# Patient Record
Sex: Male | Born: 1985 | Race: White | Hispanic: No | Marital: Single | Smoking: Current every day smoker
Health system: Southern US, Community
[De-identification: ages and names within clinical notes are randomized; demographics above are authoritative.]

---

## 2008-07-20 ENCOUNTER — Emergency Department: Payer: Self-pay | Admitting: Emergency Medicine

## 2009-03-08 ENCOUNTER — Emergency Department: Payer: Self-pay | Admitting: Emergency Medicine

## 2009-07-14 ENCOUNTER — Emergency Department: Payer: Self-pay | Admitting: Internal Medicine

## 2009-09-14 ENCOUNTER — Inpatient Hospital Stay: Payer: Self-pay | Admitting: Psychiatry

## 2010-11-08 ENCOUNTER — Emergency Department: Payer: Self-pay | Admitting: Emergency Medicine

## 2011-02-10 ENCOUNTER — Emergency Department: Payer: Self-pay | Admitting: Emergency Medicine

## 2013-08-15 ENCOUNTER — Encounter (HOSPITAL_COMMUNITY): Payer: Self-pay | Admitting: Emergency Medicine

## 2013-08-15 ENCOUNTER — Emergency Department (HOSPITAL_COMMUNITY): Payer: Self-pay

## 2013-08-15 ENCOUNTER — Emergency Department (HOSPITAL_COMMUNITY)
Admission: EM | Admit: 2013-08-15 | Discharge: 2013-08-15 | Disposition: A | Discharge status: Home / Self Care | Payer: Self-pay | Attending: Emergency Medicine | Admitting: Emergency Medicine

## 2013-08-15 DIAGNOSIS — Y92410 Unspecified street and highway as the place of occurrence of the external cause: Secondary | ICD-10-CM | POA: Insufficient documentation

## 2013-08-15 DIAGNOSIS — S0501XA Injury of conjunctiva and corneal abrasion without foreign body, right eye, initial encounter: Secondary | ICD-10-CM

## 2013-08-15 DIAGNOSIS — IMO0002 Reserved for concepts with insufficient information to code with codable children: Secondary | ICD-10-CM | POA: Insufficient documentation

## 2013-08-15 DIAGNOSIS — Y9389 Activity, other specified: Secondary | ICD-10-CM | POA: Insufficient documentation

## 2013-08-15 DIAGNOSIS — F17200 Nicotine dependence, unspecified, uncomplicated: Secondary | ICD-10-CM | POA: Insufficient documentation

## 2013-08-15 DIAGNOSIS — S058X9A Other injuries of unspecified eye and orbit, initial encounter: Secondary | ICD-10-CM | POA: Insufficient documentation

## 2013-08-15 LAB — COMPREHENSIVE METABOLIC PANEL WITH GFR
ALT: 32 U/L (ref 0–53)
AST: 25 U/L (ref 0–37)
Albumin: 4.7 g/dL (ref 3.5–5.2)
Alkaline Phosphatase: 74 U/L (ref 39–117)
BUN: 9 mg/dL (ref 6–23)
CO2: 29 meq/L (ref 19–32)
Calcium: 9.5 mg/dL (ref 8.4–10.5)
Chloride: 103 meq/L (ref 96–112)
Creatinine, Ser: 1.16 mg/dL (ref 0.50–1.35)
GFR calc Af Amer: >90 mL/min
GFR calc non Af Amer: 86 mL/min — ABNORMAL LOW
Glucose, Bld: 83 mg/dL (ref 70–99)
Potassium: 4.6 meq/L (ref 3.5–5.1)
Sodium: 143 meq/L (ref 135–145)
Total Bilirubin: 0.2 mg/dL — ABNORMAL LOW (ref 0.3–1.2)
Total Protein: 7.8 g/dL (ref 6.0–8.3)

## 2013-08-15 LAB — CBC WITH DIFFERENTIAL/PLATELET
Basophils Relative: 1 % (ref 0–1)
Eosinophils Absolute: 0.3 10*3/uL (ref 0.0–0.7)
Eosinophils Relative: 2 % (ref 0–5)
HCT: 50.5 % (ref 39.0–52.0)
Hemoglobin: 17.5 g/dL — ABNORMAL HIGH (ref 13.0–17.0)
MCH: 31.9 pg (ref 26.0–34.0)
MCHC: 34.7 g/dL (ref 30.0–36.0)
MCV: 92 fL (ref 78.0–100.0)
Monocytes Absolute: 0.7 10*3/uL (ref 0.1–1.0)
Monocytes Relative: 6 % (ref 3–12)
Neutrophils Relative %: 68 % (ref 43–77)
WBC: 12.9 10*3/uL — ABNORMAL HIGH (ref 4.0–10.5)

## 2013-08-15 MED ORDER — FLUORESCEIN SODIUM 1 MG OP STRP
1.0000 | ORAL_STRIP | Freq: Once | OPHTHALMIC | Status: AC
  Administered 2013-08-15: 1 via OPHTHALMIC
  Filled 2013-08-15: qty 1

## 2013-08-15 MED ORDER — TOBRAMYCIN 0.3 % OP OINT
TOPICAL_OINTMENT | Freq: Once | OPHTHALMIC | Status: AC
  Administered 2013-08-15: 1 via OPHTHALMIC
  Filled 2013-08-15: qty 3.5

## 2013-08-15 MED ORDER — LORAZEPAM 2 MG/ML IJ SOLN
2.0000 mg | Freq: Once | INTRAMUSCULAR | Status: AC
  Administered 2013-08-15: 2 mg via INTRAVENOUS
  Filled 2013-08-15: qty 1

## 2013-08-15 MED ORDER — HYDROMORPHONE HCL PF 1 MG/ML IJ SOLN
1.0000 mg | INTRAMUSCULAR | Status: AC
  Administered 2013-08-15: 1 mg via INTRAVENOUS
  Filled 2013-08-15: qty 1

## 2013-08-15 MED ORDER — TETRACAINE HCL 0.5 % OP SOLN
2.0000 [drp] | Freq: Once | OPHTHALMIC | Status: AC
  Administered 2013-08-15: 2 [drp] via OPHTHALMIC
  Filled 2013-08-15: qty 2

## 2013-08-15 NOTE — ED Notes (Signed)
Pt ambulatory to restroom

## 2013-08-15 NOTE — ED Notes (Signed)
Eye patch applied to right eye
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
## 2013-08-15 NOTE — ED Notes (Signed)
Spoke with resident, resident had previously removed neck brace

## 2013-08-15 NOTE — ED Notes (Signed)
Pt back from CT
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
## 2013-08-15 NOTE — ED Notes (Signed)
Pt transported to CT ?

## 2013-08-15 NOTE — ED Notes (Signed)
This nurse found pt with neck brace removed.

## 2013-08-15 NOTE — ED Notes (Signed)
Attempted to draw labs, unsuccessful. Contacted phlebotomy to draw.
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
## 2014-06-30 ENCOUNTER — Emergency Department: Payer: Self-pay | Admitting: Emergency Medicine

## 2016-01-25 ENCOUNTER — Encounter: Payer: Self-pay | Admitting: Emergency Medicine

## 2016-01-25 ENCOUNTER — Emergency Department
Admission: EM | Admit: 2016-01-25 | Discharge: 2016-01-25 | Disposition: A | Discharge status: Home / Self Care | Payer: Self-pay | Attending: Emergency Medicine | Admitting: Emergency Medicine

## 2016-01-25 ENCOUNTER — Emergency Department: Payer: Self-pay

## 2016-01-25 DIAGNOSIS — Y999 Unspecified external cause status: Secondary | ICD-10-CM | POA: Insufficient documentation

## 2016-01-25 DIAGNOSIS — Y939 Activity, unspecified: Secondary | ICD-10-CM | POA: Insufficient documentation

## 2016-01-25 DIAGNOSIS — S6010XA Contusion of unspecified finger with damage to nail, initial encounter: Secondary | ICD-10-CM

## 2016-01-25 DIAGNOSIS — S60012A Contusion of left thumb without damage to nail, initial encounter: Secondary | ICD-10-CM | POA: Insufficient documentation

## 2016-01-25 DIAGNOSIS — Y929 Unspecified place or not applicable: Secondary | ICD-10-CM | POA: Insufficient documentation

## 2016-01-25 DIAGNOSIS — S6702XA Crushing injury of left thumb, initial encounter: Secondary | ICD-10-CM

## 2016-01-25 DIAGNOSIS — F17200 Nicotine dependence, unspecified, uncomplicated: Secondary | ICD-10-CM | POA: Insufficient documentation

## 2016-01-25 DIAGNOSIS — W208XXA Other cause of strike by thrown, projected or falling object, initial encounter: Secondary | ICD-10-CM | POA: Insufficient documentation

## 2016-01-25 NOTE — Discharge Instructions (Signed)
Crush Injury, Fingers or Toes A crush injury means the fingers or toes are hurt by being squeezed (compressed). HOME CARE  Raise (elevate) the injured part above the level of your heart. Do this as much as you can for the first few days.  Put ice on the injured area.  Put ice in a plastic bag.  Place a towel between your skin and the bag.  Leave the ice on for 15-20 minutes, 03-04 times a day for the first 2 days.  Only take medicine as told by your doctor.  Use the injured part only as told by your doctor.  Change bandages (dressings) as told by your doctor.  Keep all doctor visits as told. GET HELP RIGHT AWAY IF:   There is redness, puffiness (swelling), or more pain in the injured finger or toe.  Yellowish-white fluid (pus) comes from the wound.  You have a fever.  A bad smell comes from the wound or bandage.  The wound breaks open.  You cannot move the injured finger or toe. MAKE SURE YOU:   Understand these instructions.  Will watch your condition.  Will get help right away if you are not doing well or get worse.   This information is not intended to replace advice given to you by your health care provider. Make sure you discuss any questions you have with your health care provider.   Document Released: 02/05/2010 Document Revised: 11/10/2011 Document Reviewed: 01/03/2011 Elsevier Interactive Patient Education 2016 [employer]  Subungual Hematoma  A subungual hematoma is a pocket of blood under the fingernail or toenail. The nail may turn blue or feel painful. HOME CARE  Put ice on the injured area.  Put ice in a plastic bag.  Place a towel between your skin and the bag.  Leave the ice on for 15-20 minutes, 03-04 times a day. Do this for the first 1 to 2 days.  Raise (elevate) the injured area to lessen pain and puffiness (swelling).  If you were given a bandage, wear it for as long as told by your doctor.  If part of your nail falls off, trim  the rest of the nail gently.  Only take medicines as told by your doctor. GET HELP RIGHT AWAY IF:  You have redness or puffiness around the nail.  You have yellowish-white fluid (pus) coming from the nail.  Your pain does not get better with medicine.  You have a fever. MAKE SURE YOU:  Understand these instructions.  Will watch your condition.  Will get help right away if you are not doing well or get worse.   This information is not intended to replace advice given to you by your health care provider. Make sure you discuss any questions you have with your health care provider.   Document Released: 11/10/2011 Document Reviewed: 01/03/2015 Elsevier Interactive Patient Education [employer]

## 2016-01-25 NOTE — ED Notes (Signed)
States he had something drop on to left thumb yesterday  Left thumb bruised and swollen

## 2016-01-25 NOTE — ED Provider Notes (Signed)
[facility] Emergency Department Provider Note ____________________________________________  Time seen: 1015  I have reviewed the triage vital signs and the nursing notes.  HISTORY  Chief Complaint  Finger Injury  HPI Nicholas Benson is a 30 y.o. male presents to the ED for evaluation of injury sustained to his left thumb yesterday.He is a right-hand dominant male who describes a crush injury to his left thumb yesterday. He reports good range of motion at the distal joint, but notes tightness and stiffness at the end of the fingertip. He also notices discoloration under the nail. He denies any cuts, scrapes, abrasions, or lacerations. He rates his discomfort at a 7/10 in triage.  History reviewed. No pertinent past medical history.  There are no active problems to display for this patient.  History reviewed. No pertinent past surgical history.  Current Outpatient Rx  Name  Route  Sig  Dispense  Refill  . ibuprofen (ADVIL,MOTRIN) 200 MG tablet   Oral   Take 200 mg by mouth every 6 (six) hours as needed for moderate pain.          Allergies Review of patient's allergies indicates no known allergies.  No family history on file.  Social History Social History  Substance Use Topics  . Smoking status: Current Every Day Smoker  . Smokeless tobacco: None  . Alcohol Use: Yes    Review of Systems  Constitutional: Negative for fever. Musculoskeletal: Negative for back pain. Left thumb pain as above Skin: Negative for rash. Neurological: Negative for headaches, focal weakness or numbness. ____________________________________________  PHYSICAL EXAM:  VITAL SIGNS: ED Triage Vitals  Enc Vitals Group     BP 01/25/16 0953 141/92 mmHg     Pulse Rate 01/25/16 0953 78     Resp 01/25/16 0953 17     Temp 01/25/16 0953 97.5 F (36.4 C)     Temp Source 01/25/16 0953 Oral     SpO2 01/25/16 0953 97 %     Weight 01/25/16 0953 160 lb (72.576 kg)   Height 01/25/16 0953 5\' 9"  (1.753 m)     Head Cir --      Peak Flow --      Pain Score 01/25/16 1002 7     Pain Loc --      Pain Edu? --      Excl. in GC? --    Constitutional: Alert and oriented. Well appearing and in no distress. Head: Normocephalic and atraumatic. Cardiovascular: Normal distal pulses and cap refill.  Respiratory: Normal respiratory effort. Musculoskeletal: No obvious deformity of the lef thumb. Patient with a 25% subungal hematoma noted proximally. No nailbed injury or finger laceration is appreciated. Mild distal phalanx edema is noted. Normal DIP ROM. Nontender with normal range of motion in all other extremities.  Neurologic: CN II-XII grossly intact. Normal gross sensation. Normal speech and language. No gross focal neurologic deficits are appreciated. Skin:  Skin is warm, dry and intact. No rash noted. ____________________________________________   RADIOLOGY  Left Thumb  IMPRESSION: Negative. ____________________________________________  SUBUNGAL HEMATOMA DRAINAGE Performed by: Lissa HoardMenshew, Stellah Donovan V Bacon Consent: Verbal consent obtained. Risks and benefits: risks, benefits and alternatives were discussed Type: abscess  Body area: left thumb  Incision was made with a electrocautery.  Complexity: simple   Drainage: bloody  Drainage amount: minimal  Patient tolerance: Patient tolerated the procedure well with no immediate complications. ____________________________________________  INITIAL IMPRESSION / ASSESSMENT AND PLAN / ED COURSE  Patient with a crush injury to the  left thumb without radiologic evidence of fracture dislocation. He sustained a small subungual hematoma following the injury. He will be discharged with instruction on crush injury management and management of subungual hematoma. ____________________________________________  FINAL CLINICAL IMPRESSION(S) / ED DIAGNOSES  Final diagnoses:  Crushing injury of left thumb, initial  encounter  Hematoma, subungual, finger, left, initial encounter     Lissa Hoard, PA-C 01/25/16 1124  Phineas Semen, MD 01/25/16 1214

## 2016-01-25 NOTE — ED Notes (Signed)
Patient left facility before discharge instructions or papers could be given.  Patient ambulated well from facility in NAD at that time.

## 2017-02-08 ENCOUNTER — Emergency Department
Admission: EM | Admit: 2017-02-08 | Discharge: 2017-02-08 | Disposition: A | Discharge status: Home / Self Care | Payer: Self-pay | Attending: Emergency Medicine | Admitting: Emergency Medicine

## 2017-02-08 ENCOUNTER — Encounter: Payer: Self-pay | Admitting: *Deleted

## 2017-02-08 DIAGNOSIS — H02826 Cysts of left eye, unspecified eyelid: Secondary | ICD-10-CM | POA: Insufficient documentation

## 2017-02-08 DIAGNOSIS — F17200 Nicotine dependence, unspecified, uncomplicated: Secondary | ICD-10-CM | POA: Insufficient documentation

## 2017-02-08 NOTE — ED Triage Notes (Signed)
States cyst over left eye for 6 months, states his peripheral vision has been affected

## 2017-02-08 NOTE — ED Provider Notes (Signed)
[facility] Emergency Department Provider Note   ____________________________________________   I have reviewed the triage vital signs and the nursing notes.   HISTORY  Chief Complaint Cyst    HPI Nicholas Benson is a 31 y.o. male presents with left lateral eye chalazion has been present for approximately 6 months. Patient reports chalazion is beginning to impede his left peripheral vision. He denies any infective symptoms at this time. Patient denies fever, chills, headache, vision changes, chest pain, chest tightness, shortness of breath, abdominal pain, nausea and vomiting.  History reviewed. No pertinent past medical history.  There are no active problems to display for this patient.   History reviewed. No pertinent surgical history.  Prior to Admission medications   Medication Sig Start Date End Date Taking? Authorizing Provider  ibuprofen (ADVIL,MOTRIN) 200 MG tablet Take 200 mg by mouth every 6 (six) hours as needed for moderate pain.    [provider]    Allergies Patient has no known allergies.  History reviewed. No pertinent family history.  Social History Social History  Substance Use Topics  . Smoking status: Current Every Day Smoker  . Smokeless tobacco: Not on file  . Alcohol use Yes    Review of Systems Constitutional: Negative for fever/chills Eyes: No visual changes. Slightly impeding left peripheral vision. Cardiovascular: Denies chest pain. Respiratory: Denies cough Denies shortness of breath. Musculoskeletal: Negative for back pain. Negative for generalized body aches. Skin: negative for rash. Neurological: Negative for headaches.  ____________________________________________   PHYSICAL EXAM:  VITAL SIGNS: ED Triage Vitals  Enc Vitals Group     BP 02/08/17 1116 (!) 162/83     Pulse Rate 02/08/17 1116 94     Resp 02/08/17 1116 16     Temp 02/08/17 1116 98.4 F (36.9 C)     Temp Source 02/08/17  1116 Oral     SpO2 02/08/17 1116 95 %     Weight 02/08/17 1117 165 lb (74.8 kg)     Height 02/08/17 1117 5\' 9"  (1.753 m)     Head Circumference --      Peak Flow --      Pain Score --      Pain Loc --      Pain Edu? --      Excl. in GC? --     Constitutional: Alert and oriented. Well appearing and in no acute distress.  Head: Normocephalic and atraumatic. Eyes: Conjunctivae are normal. PERRL. Normal extraocular movements. Chalazion located left lateral eye approximately 1 cm in diameter nonpainful mildly interfering with peripheral vision. Non-erythematous. Cardiovascular: Normal rate, regular rhythm. Normal distal pulses. Respiratory: Normal respiratory effort.  Neurologic: Normal speech and language.  Skin:  Skin is warm, dry and intact. No rash noted. Psychiatric: Mood and affect are normal.  ____________________________________________   LABS (all labs ordered are listed, but only abnormal results are displayed)  Labs Reviewed - No data to display ____________________________________________  EKG  none ____________________________________________  RADIOLOGY  none ____________________________________________   PROCEDURES  Procedure(s) performed: no    Critical Care performed: no ____________________________________________   INITIAL IMPRESSION / ASSESSMENT AND PLAN / ED COURSE  Pertinent labs & imaging results that were available during my care of the patient were reviewed by me and considered in my medical decision making (see chart for details).   Patient presented with chalazion located along the lateral left eye. Physical exam was reassuring structure was not impeding with vision except for very mildly affecting peripheral  vision and no sign of infection. Patient will be given a referral to follow-up with ophthalmology for continued care. Explained rationale to the patient why ophthalmology was the more appropriate route for management for this issue and  patient verbalized understanding. Patient was advised to follow up with ophthalmology and was also advised to return to the emergency department for symptoms that change or worsen.      ____________________________________________   FINAL CLINICAL IMPRESSION(S) / ED DIAGNOSES  Final diagnoses:  Cyst of left eyelid       NEW MEDICATIONS STARTED DURING THIS VISIT:  Discharge Medication List as of 02/08/2017 12:30 PM       Note:  This document was prepared using Dragon voice recognition software and may include unintentional dictation errors.    Clois ComberLittle, Traci M, PA-C 02/08/17 1330    Merrily Brittleifenbark, Neil, MD 02/08/17 1409

## 2017-02-08 NOTE — ED Notes (Signed)
NAD noted at time of D/C. Pt denies questions or concerns. Pt ambulatory to the lobby at this time.  

## 2017-02-08 NOTE — Discharge Instructions (Signed)
Call for a follow up appointment with Opthalmology

## 2017-02-12 ENCOUNTER — Emergency Department
Admission: EM | Admit: 2017-02-12 | Discharge: 2017-02-12 | Disposition: A | Discharge status: Home / Self Care | Payer: Self-pay | Attending: Emergency Medicine | Admitting: Emergency Medicine

## 2017-02-12 ENCOUNTER — Encounter: Payer: Self-pay | Admitting: *Deleted

## 2017-02-12 DIAGNOSIS — Z5321 Procedure and treatment not carried out due to patient leaving prior to being seen by health care provider: Secondary | ICD-10-CM | POA: Insufficient documentation

## 2017-02-12 DIAGNOSIS — N4889 Other specified disorders of penis: Secondary | ICD-10-CM | POA: Insufficient documentation

## 2017-02-12 NOTE — ED Triage Notes (Signed)
Pt reports penile irritation.  No penile discharge.  No dysuria.  Sx for 3 days.

## 2017-02-15 ENCOUNTER — Encounter: Payer: Self-pay | Admitting: Emergency Medicine

## 2017-02-15 ENCOUNTER — Emergency Department
Admission: EM | Admit: 2017-02-15 | Discharge: 2017-02-15 | Disposition: A | Discharge status: Home / Self Care | Payer: Self-pay | Attending: Emergency Medicine | Admitting: Emergency Medicine

## 2017-02-15 DIAGNOSIS — F17200 Nicotine dependence, unspecified, uncomplicated: Secondary | ICD-10-CM | POA: Insufficient documentation

## 2017-02-15 DIAGNOSIS — Z711 Person with feared health complaint in whom no diagnosis is made: Secondary | ICD-10-CM | POA: Insufficient documentation

## 2017-02-15 NOTE — ED Triage Notes (Signed)
Pt states that he would like to be checked for STD, patient was recently dx'ed with STD.

## 2017-02-15 NOTE — ED Provider Notes (Signed)
[facility] Emergency Department Provider Note ____________________________________________  Time seen:   I have reviewed the triage vital signs and the nursing notes.  HISTORY  Chief Complaint  STD check   HPI Nicholas Benson is a 31 y.o. male is here today with concerns of STD. Patient states that his partner was here in the emergency room diagnoses having bacterial vaginosis. Patient states he looked online and saw that this was not contagious however he is here for reassurance. He denies any symptoms. He denies any penile discharge. He has no difficulty with urination.  History reviewed. No pertinent past medical history.  There are no active problems to display for this patient.   History reviewed. No pertinent surgical history.  Prior to Admission medications   Not on File    Allergies Patient has no known allergies.  No family history on file.  Social History Social History  Substance Use Topics  . Smoking status: Current Every Day Smoker  . Smokeless tobacco: Never Used  . Alcohol use Yes    Review of Systems  Constitutional: Negative for fever. Cardiovascular: Negative for chest pain. Respiratory: Negative for shortness of breath. Genitourinary: Negative for dysuria. Musculoskeletal: Negative for back pain. Skin: Negative for rash. ____________________________________________  PHYSICAL EXAM:  VITAL SIGNS: ED Triage Vitals  Enc Vitals Group     BP 02/15/17 1242 (!) 139/93     Pulse Rate 02/15/17 1242 97     Resp 02/15/17 1242 18     Temp 02/15/17 1242 98.3 F (36.8 C)     Temp Source 02/15/17 1242 Oral     SpO2 02/15/17 1242 98 %     Weight 02/15/17 1235 165 lb (74.8 kg)     Height 02/15/17 1242 5\' 9"  (1.753 m)     Head Circumference --      Peak Flow --      Pain Score 02/15/17 1242 0     Pain Loc --      Pain Edu? --      Excl. in GC? --     Constitutional: Alert and oriented. Well appearing and in no  distress. Neck: No stridor Hematological/Lymphatic/Immunological: No cervical lymphadenopathy. Cardiovascular: Normal rate, regular rhythm. Normal distal pulses. Respiratory: Normal respiratory effort. No wheezes/rales/rhonchi. Neurologic:  Normal gait without ataxia. Normal speech and language. No gross focal neurologic deficits are appreciated. Skin:  Skin is warm, dry and intact. No rash noted. Psychiatric: Mood and affect are normal. Patient exhibits appropriate insight and judgment.  ____________________________________________  INITIAL IMPRESSION / ASSESSMENT AND PLAN / ED COURSE      ____________________________________________  FINAL CLINICAL IMPRESSION(S) / ED DIAGNOSES  Final diagnoses:  Feared complaint without diagnosis     Tommi RumpsSummers, Jalin Alicea L, PA-C 02/15/17 1544    Sharyn CreamerQuale, Mark, MD 02/20/17 1709

## 2017-02-15 NOTE — Discharge Instructions (Signed)
Follow-up with health department if any concerns or any symptoms that are concerning for STDs.

## 2017-02-15 NOTE — ED Notes (Signed)
See triage note.  States he wants to be checked for STD  Denies any discharge or painful urination  States sexual partner was treated for BV last week

## 2017-04-04 ENCOUNTER — Encounter: Payer: Self-pay | Admitting: Emergency Medicine

## 2017-04-04 ENCOUNTER — Emergency Department: Payer: Self-pay

## 2017-04-04 ENCOUNTER — Emergency Department
Admission: EM | Admit: 2017-04-04 | Discharge: 2017-04-04 | Disposition: A | Discharge status: Home / Self Care | Payer: Self-pay | Attending: Emergency Medicine | Admitting: Emergency Medicine

## 2017-04-04 DIAGNOSIS — F17210 Nicotine dependence, cigarettes, uncomplicated: Secondary | ICD-10-CM | POA: Insufficient documentation

## 2017-04-04 DIAGNOSIS — L0291 Cutaneous abscess, unspecified: Secondary | ICD-10-CM

## 2017-04-04 DIAGNOSIS — L03213 Periorbital cellulitis: Secondary | ICD-10-CM | POA: Insufficient documentation

## 2017-04-04 MED ORDER — LIDOCAINE HCL (PF) 1 % IJ SOLN
5.0000 mL | Freq: Once | INTRAMUSCULAR | Status: DC
  Filled 2017-04-04: qty 5

## 2017-04-04 MED ORDER — CLINDAMYCIN HCL 300 MG PO CAPS: 300.0000 mg | ORAL_CAPSULE | Freq: Three times a day (TID) | ORAL | 0 refills | Status: AC

## 2017-04-04 MED ORDER — IOPAMIDOL (ISOVUE-300) INJECTION 61%
75.0000 mL | Freq: Once | INTRAVENOUS | Status: AC | PRN
  Administered 2017-04-04: 75 mL via INTRAVENOUS

## 2017-04-04 MED ORDER — SULFAMETHOXAZOLE-TRIMETHOPRIM 800-160 MG PO TABS: 1.0000 | ORAL_TABLET | Freq: Once | ORAL | Status: DC

## 2017-04-04 MED ORDER — CLINDAMYCIN HCL 150 MG PO CAPS
300.0000 mg | ORAL_CAPSULE | Freq: Once | ORAL | Status: AC
  Administered 2017-04-04: 300 mg via ORAL
  Filled 2017-04-04: qty 2

## 2017-04-04 NOTE — ED Notes (Signed)
Patient ambulatory to stat desk without difficulty or distress.  Patient reports swelling above right eye for several days.

## 2017-04-04 NOTE — ED Triage Notes (Signed)
Pt says he's had a knot beside his left eye, just at the end of his eyebrow, for 3-4 months; he noticed some dry skin to the area this am that he scratched off; area has now grown much bigger, redness to area; swelling across eyelid and now below eye as well; pt reports throbbing and pressure; no drainage from site; pt says he did push on area but was scared to press on it too much;

## 2017-04-04 NOTE — Discharge Instructions (Signed)
Please seek medical attention for any high fevers, chest pain, shortness of breath, change in behavior, persistent vomiting, bloody stool or any other new or concerning symptoms.  

## 2017-04-04 NOTE — ED Provider Notes (Signed)
[facility] Emergency Department Provider Note   ____________________________________________   I have reviewed the triage vital signs and the nursing notes.   HISTORY  Chief Complaint Abscess   History limited by: Not Limited   HPI Nicholas Benson is a 31 y.o. male who presents to the emergency department today because of concerns for left eye swelling and pain. Patient states that he is had a small amount of swelling to his left eyelid for months. In the past few days however he scratched it and got a little bit of substance out. Since that time it is become increasingly swollen and inflamed. It is painful. States he started having a hard time seeing out of his eyes secondary to the swelling. He also describes some watering of his eye. Patient denies any fevers. Denies any pain with movement of his eyes.    History reviewed. No pertinent past medical history.  There are no active problems to display for this patient.   History reviewed. No pertinent surgical history.  Prior to Admission medications   Not on File    Allergies Patient has no known allergies.  History reviewed. No pertinent family history.  Social History Social History  Substance Use Topics  . Smoking status: Current Every Day Smoker    Packs/day: 1.00    Years: 8.00    Types: Cigarettes  . Smokeless tobacco: Never Used  . Alcohol use No    Review of Systems Constitutional: No fever/chills Eyes: Positive for left eye swelling. ENT: No sore throat. Cardiovascular: Denies chest pain. Respiratory: Denies shortness of breath. Gastrointestinal: No abdominal pain.  No nausea, no vomiting.  No diarrhea.   Genitourinary: Negative for dysuria. Musculoskeletal: Negative for back pain. Skin: Negative for rash. Neurological: Negative for headaches, focal weakness or numbness.  ____________________________________________   PHYSICAL EXAM:  VITAL SIGNS: ED Triage Vitals   Enc Vitals Group     BP 04/04/17 1935 131/82     Pulse Rate 04/04/17 1935 (!) 112     Resp 04/04/17 1935 18     Temp 04/04/17 1935 99 F (37.2 C)     Temp Source 04/04/17 1935 Oral     SpO2 04/04/17 1935 97 %     Weight 04/04/17 1935 165 lb (74.8 kg)     Height 04/04/17 1935 5\' 9"  (1.753 m)     Head Circumference --      Peak Flow --      Pain Score 04/04/17 1934 4   Constitutional: Alert and oriented. Well appearing and in no distress. Eyes: Left upper eyelid with significant swelling with fluctuant tender area to the lateral aspect. The left lower eyelid with some swelling as well. ENT   Head: Normocephalic and atraumatic.   Nose: No congestion/rhinnorhea.   Mouth/Throat: Mucous membranes are moist.   Neck: No stridor. Hematological/Lymphatic/Immunilogical: No cervical lymphadenopathy. Cardiovascular: Normal rate, regular rhythm.  No murmurs, rubs, or gallops.  Respiratory: Normal respiratory effort without tachypnea nor retractions. Breath sounds are clear and equal bilaterally. No wheezes/rales/rhonchi. Gastrointestinal: Soft and non tender. No rebound. No guarding.  Genitourinary: Deferred Musculoskeletal: Normal range of motion in all extremities. No lower extremity edema. Neurologic:  Normal speech and language. No gross focal neurologic deficits are appreciated.  Skin:  Skin is warm, dry and intact.  Psychiatric: Mood and affect are normal. Speech and behavior are normal. Patient exhibits appropriate insight and judgment.  ____________________________________________    LABS (pertinent positives/negatives)  None  ____________________________________________  EKG  None  ____________________________________________    RADIOLOGY  CT orbits  IMPRESSION: Left orbit preseptal cellulitis with an 11 x 13 mm superficial abscess (series 3, image 24). No postseptal involvement, bony changes or other complicating  features.  ____________________________________________   PROCEDURES  Procedures  Incision and Drainage of Abcess Location: left eyebrow Anesthesia Local: 1% Lidocaine without Epi  Prep/Procedure: Skin Prep: Chlorahex Incised abscess with #11 blade Purulent discharge: moderate Probed to break up loculations Packed with 1/4" gauze Estimated blood loss: 1 ml  ____________________________________________   INITIAL IMPRESSION / ASSESSMENT AND PLAN / ED COURSE  Pertinent labs & imaging results that were available during my care of the patient were reviewed by me and considered in my medical decision making (see chart for details).  Patient presented to the emergency department today because of concerns for swelling to his left eye. Exam and CT scan is consistent with an abscess. This does show some preseptal cellulitis however no post septal involvement. Abscess was I&D. The patient will be given first dose of antibiotics emergency department discharge with further advice. Discussed with patient importance of follow-up.  ____________________________________________   FINAL CLINICAL IMPRESSION(S) / ED DIAGNOSES  Final diagnoses:  Abscess  Preseptal cellulitis     Note: This dictation was prepared with Dragon dictation. Any transcriptional errors that result from this process are unintentional     Phineas SemenGoodman, Quavon Keisling, MD 04/04/17 2146

## 2018-01-05 ENCOUNTER — Emergency Department: Payer: Self-pay

## 2018-01-05 ENCOUNTER — Emergency Department
Admission: EM | Admit: 2018-01-05 | Discharge: 2018-01-05 | Disposition: A | Discharge status: Home / Self Care | Payer: Self-pay | Attending: Emergency Medicine | Admitting: Emergency Medicine

## 2018-01-05 ENCOUNTER — Other Ambulatory Visit: Payer: Self-pay

## 2018-01-05 DIAGNOSIS — F17210 Nicotine dependence, cigarettes, uncomplicated: Secondary | ICD-10-CM | POA: Insufficient documentation

## 2018-01-05 DIAGNOSIS — M25561 Pain in right knee: Secondary | ICD-10-CM | POA: Insufficient documentation

## 2018-01-05 MED ORDER — NAPROXEN 500 MG PO TABS: 500.0000 mg | ORAL_TABLET | Freq: Two times a day (BID) | ORAL | 0 refills | Status: DC

## 2018-01-05 NOTE — ED Provider Notes (Signed)
[facility] Emergency Department Provider Note  ____________________________________________   First MD Initiated Contact with Patient 01/05/18 [id number]     (approximate)  I have reviewed the triage vital signs and the nursing notes.   HISTORY  Chief Complaint Knee Pain   HPI Nicholas Benson is a 32 y.o. male is here with complaint of right knee pain for several weeks.  Patient denies any known injury.  He states that during the weeks that he is been having pain he has taken Tylenol once.  Patient has continued to be ambulatory without assistance.  He states he had an injury to his knee several years ago at which time it was x-rayed and read as negative.  Patient has never followed up with an orthopedist since that time.  Patient is a Designer, fashion/clothing by trade.  Currently rates his pain as 6/10.   No past medical history on file.  There are no active problems to display for this patient.   No past surgical history on file.  Prior to Admission medications   Medication Sig Start Date End Date Taking? Authorizing Provider  naproxen (NAPROSYN) 500 MG tablet Take 1 tablet (500 mg total) by mouth 2 (two) times daily with a meal. 01/05/18   Tommi Rumps, PA-C    Allergies Patient has no known allergies.  No family history on file.  Social History Social History   Tobacco Use  . Smoking status: Current Every Day Smoker    Packs/day: 1.00    Years: 8.00    Pack years: 8.00    Types: Cigarettes  . Smokeless tobacco: Never Used  Substance Use Topics  . Alcohol use: No  . Drug use: No    Review of Systems Constitutional: No fever/chills Cardiovascular: Denies chest pain. Respiratory: Denies shortness of breath. Gastrointestinal:   No nausea, no vomiting.  Musculoskeletal: Positive for right knee pain. Skin: Negative for rash. Neurological: Negative for  focal weakness or numbness. ____________________________________________   PHYSICAL  EXAM:  VITAL SIGNS: ED Triage Vitals  Enc Vitals Group     BP 01/05/18 0648 119/68     Pulse Rate 01/05/18 0647 90     Resp 01/05/18 0647 18     Temp 01/05/18 0647 98.3 F (36.8 C)     Temp Source 01/05/18 0647 Oral     SpO2 01/05/18 0647 100 %     Weight 01/05/18 0647 165 lb (74.8 kg)     Height 01/05/18 0647  (1.753 m)     Head Circumference --      Peak Flow --      Pain Score 01/05/18 0647 6     Pain Loc --      Pain Edu? --      Excl. in GC? --    Constitutional: Alert and oriented. Well appearing and in no acute distress. Eyes: Conjunctivae are normal.  Head: Atraumatic. Neck: No stridor.   Cardiovascular: Normal rate, regular rhythm. Grossly normal heart sounds.  Good peripheral circulation. Respiratory: Normal respiratory effort.  No retractions. Lungs CTAB. Musculoskeletal: On examination of the right knee there is no gross deformity.  There is diffuse tenderness mainly on the lateral aspect of the right knee.  No appreciated effusion.  Minimal crepitus with range of motion.  Ligaments are stable bilaterally.  Skin is intact without erythema or ecchymosis.  No abrasions were noted. Neurologic:  Normal speech and language. No gross focal neurologic deficits are appreciated.  Skin:  Skin  is warm, dry and intact.  Psychiatric: Mood and affect are normal. Speech and behavior are normal.  ____________________________________________   LABS (all labs ordered are listed, but only abnormal results are displayed)  Labs Reviewed - No data to display RADIOLOGY  ED MD interpretation:   Right knee x-ray is negative for dislocation or fracture.  Official radiology report(s): Dg Knee Complete 4 Views Right  Result Date: 01/05/2018 CLINICAL DATA:  Several weeks of right knee pain.  No known injury. EXAM: RIGHT KNEE - COMPLETE 4+ VIEW COMPARISON:  None in PACs FINDINGS: The bones are subjectively adequately mineralized. The joint spaces are well maintained. There is no  significant osteophyte formation. There is no acute fracture or dislocation. There may be a small suprapatellar effusion. IMPRESSION: Possible suprapatellar effusion. No acute or significant chronic bony abnormality. Electronically Signed   By: David  [place] M.D.   On: 01/05/2018 07:25    ____________________________________________   PROCEDURES  Procedure(s) performed:   .Splint Application Date/Time: 01/05/2018 8:41 AM Performed by: Madelyn Flavors, NT Authorized by: Tommi Rumps, PA-C   Consent:    Consent obtained:  Verbal   Consent given by:  Patient   Alternatives discussed:  Referral Pre-procedure details:    Sensation:  Normal Procedure details:    Laterality:  Right   Location:  Knee   Knee:  R knee   Strapping: yes     Splint type:  Knee immobilizer Post-procedure details:    Pain:  Unchanged   Sensation:  Normal   Patient tolerance of procedure:  Tolerated well, no immediate complications    Critical Care performed: No  ____________________________________________   INITIAL IMPRESSION / ASSESSMENT AND PLAN / ED COURSE  As part of my medical decision making, I reviewed the following data within the electronic MEDICAL RECORD NUMBER Notes from prior ED visits and [place] Controlled Substance Database  Patient is here with complaint of right knee pain for several weeks without history of injury.  X-rays were reassuring and patient was placed in a knee immobilizer.  He was also given a prescription for naproxen 500 mg twice daily with food.  He is to follow-up with Dr. Ernest Pine if any continued problems with his knee.  He was also given a note to remain off of work for the next 2 days.  ____________________________________________   FINAL CLINICAL IMPRESSION(S) / ED DIAGNOSES  Final diagnoses:  Acute pain of right knee     ED Discharge Orders        Ordered    naproxen (NAPROSYN) 500 MG tablet  2 times daily with meals     01/05/18 0750       Note:   This document was prepared using Dragon voice recognition software and may include unintentional dictation errors.    Tommi Rumps, PA-C 01/05/18 1610    Jeanmarie Plant, MD 01/05/18 [phone]

## 2018-01-05 NOTE — ED Triage Notes (Signed)
Pt in with co right knee pain for few weeks no known injury.

## 2018-01-05 NOTE — Discharge Instructions (Addendum)
Follow-up with Dr. Ernest Pine if any continued problems with your knee.  Ice and elevate when sitting.  Wear knee immobilizer anytime you are up walking for the next 2 days.  You may also continue to take Tylenol however a prescription for naproxen 500 mg twice daily with food should be taken on a daily basis.

## 2018-01-05 NOTE — ED Notes (Signed)
See triage note  Presents with pain to right knee  States pain started couple of weeks ago  Denies any injury  No swelling noted

## 2018-03-15 ENCOUNTER — Encounter: Payer: Self-pay | Admitting: Emergency Medicine

## 2018-03-15 ENCOUNTER — Other Ambulatory Visit: Payer: Self-pay

## 2018-03-15 ENCOUNTER — Emergency Department
Admission: EM | Admit: 2018-03-15 | Discharge: 2018-03-15 | Disposition: A | Discharge status: Home / Self Care | Payer: Self-pay | Attending: Emergency Medicine | Admitting: Emergency Medicine

## 2018-03-15 DIAGNOSIS — F17210 Nicotine dependence, cigarettes, uncomplicated: Secondary | ICD-10-CM | POA: Insufficient documentation

## 2018-03-15 DIAGNOSIS — L739 Follicular disorder, unspecified: Secondary | ICD-10-CM | POA: Insufficient documentation

## 2018-03-15 MED ORDER — MUPIROCIN 2 % EX OINT: 1.0000 "application " | TOPICAL_OINTMENT | Freq: Two times a day (BID) | CUTANEOUS | 1 refills | Status: AC

## 2018-03-15 MED ORDER — CEPHALEXIN 500 MG PO CAPS: 500.0000 mg | ORAL_CAPSULE | Freq: Three times a day (TID) | ORAL | 0 refills | Status: AC

## 2018-03-15 NOTE — ED Provider Notes (Signed)
[facility] Emergency Department Provider Note  ____________________________________________   First MD Initiated Contact with Patient 03/15/18 1042     (approximate)  I have reviewed the triage vital signs and the nursing notes.   HISTORY  Chief Complaint Insect Bite    HPI Nicholas Benson is a 32 y.o. male resents to the emergency department with pustules on the right knee.  He has this small amount of redness.  Thinks he was bitten by a spider.  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  He denies any fever or chills.  No drainage from the areas.   History reviewed. No pertinent past medical history.  There are no active problems to display for this patient.   History reviewed. No pertinent surgical history.  Prior to Admission medications   Medication Sig Start Date End Date Taking? Authorizing Provider  cephALEXin (KEFLEX) 500 MG capsule Take 1 capsule (500 mg total) by mouth 3 (three) times daily. 03/15/18   Fisher, Roselyn BeringSusan W, PA-C  mupirocin ointment (BACTROBAN) 2 % Apply 1 application topically 2 (two) times daily. 03/15/18   Faythe GheeFisher, Susan W, PA-C    Allergies Patient has no known allergies.  History reviewed. No pertinent family history.  Social History Social History   Tobacco Use  . Smoking status: Current Every Day Smoker    Packs/day: 1.00    Years: 8.00    Pack years: 8.00    Types: Cigarettes  . Smokeless tobacco: Never Used  Substance Use Topics  . Alcohol use: No  . Drug use: No    Review of Systems  Constitutional: No fever/chills Eyes: No visual changes. ENT: No sore throat. Respiratory: Denies cough Genitourinary: Negative for dysuria. Musculoskeletal: Negative for back pain. Skin: Positive for rash.    ____________________________________________   PHYSICAL EXAM:  VITAL SIGNS: ED Triage Vitals [03/15/18 0944]  Enc Vitals Group     BP 130/61     Pulse Rate 81      Resp 16     Temp 98.3 F (36.8 C)     Temp Source Oral     SpO2 100 %     Weight 165 lb (74.8 kg)     Height 5\' 9"  (1.753 m)     Head Circumference      Peak Flow      Pain Score 5     Pain Loc      Pain Edu?      Excl. in GC?     Constitutional: Alert and oriented. Well appearing and in no acute distress. Eyes: Conjunctivae are normal.  Head: Atraumatic. Nose: No congestion/rhinnorhea. Mouth/Throat: Mucous membranes are moist.   Cardiovascular: Normal rate, regular rhythm. Respiratory: Normal respiratory effort.  No retractions GU: deferred Musculoskeletal: FROM all extremities, warm and well perfused Neurologic:  Normal speech and language.  Skin:  Skin is warm, dry and intact.  Positive for rough dry areas on both knees.  The right knee has a small pustule noted on 1 of the bumps. Psychiatric: Mood and affect are normal. Speech and behavior are normal.  ____________________________________________   LABS (all labs ordered are listed, but only abnormal results are displayed)  Labs Reviewed - No data to display ____________________________________________   ____________________________________________  RADIOLOGY    ____________________________________________   PROCEDURES  Procedure(s) performed: No  Procedures    ____________________________________________   INITIAL IMPRESSION / ASSESSMENT AND PLAN / ED COURSE  Pertinent  labs & imaging results that were available during my care of the patient were reviewed by me and considered in my medical decision making (see chart for details).  Patient is a 32 year old male presents emergency department complaining of a rash with a questionable spider bite to the right knee.  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  He does get the small bumps frequently.  Physical exam of the knees have rough areas noted.  They are very dry to touch.  There are 2 small pustules noted on the right  knee.  Discussed the physical exam findings with patient.  The pustules are associated at the bottom of a hair follicle.  Explained to him that he needs to apply warm compress to this area.  He was given a prescription for Keflex and Bactroban ointment.  He is to return to the emergency department if worsening.  States he understands comply with our instructions.  He was discharged in stable condition     As part of my medical decision making, I reviewed the following data within the electronic MEDICAL RECORD NUMBER Nursing notes reviewed and incorporated, Old chart reviewed, Notes from prior ED visits and [place] Controlled Substance Database  ____________________________________________   FINAL CLINICAL IMPRESSION(S) / ED DIAGNOSES  Final diagnoses:  Folliculitis      NEW MEDICATIONS STARTED DURING THIS VISIT:  Discharge Medication List as of 03/15/2018 10:58 AM    START taking these medications   Details  cephALEXin (KEFLEX) 500 MG capsule Take 1 capsule (500 mg total) by mouth 3 (three) times daily., Starting Mon 03/15/2018, Print    mupirocin ointment (BACTROBAN) 2 % Apply 1 application topically 2 (two) times daily., Starting Mon 03/15/2018, Print         Note:  This document was prepared using Dragon voice recognition software and may include unintentional dictation errors.    Faythe Ghee, PA-C 03/15/18 1646    Emily Filbert, MD 03/16/18 [phone]

## 2018-03-15 NOTE — Discharge Instructions (Addendum)
Follow-up with your regular doctor if not better in 3 to 5 days.  Return emergency department worsening.  Use medication as prescribed °

## 2018-03-15 NOTE — ED Notes (Signed)
See triage note  Presents with redness and pain to right knee  Thinks he may have been stung by something

## 2018-03-15 NOTE — ED Triage Notes (Signed)
Pt to ed with c/o 2 areas to right knee, very scant amount of redness noted.  Pt states he thinks he may have been bit by a spider. Reports he noticed areas about 2 days ago.

## 2018-09-25 IMAGING — CT CT ORBITS W/ CM
3 series · 14 of 47 positions shown, 16 images · IV contrast (iopamidol)
Comparison: Face CT without contrast 08/15/2013.

CLINICAL DATA: 30-year-old male with palpable abnormality along the
left eyebrow discovered 3-4 months ago. Now with redness and
swelling. Pain and pressure, but no drainage.

EXAM:
CT ORBITS WITH CONTRAST
TECHNIQUE: Multidetector CT images was performed according to the standard
protocol following intravenous contrast administration.
CONTRAST:  75mL [id number] IOPAMIDOL ([id number]) INJECTION 61%

[Series 3: orbits 2.0 h30s st · axial · 0.29mm/px · z∈[-151,-81]mm · 8 of 41 slices shown, 10 images]
[im 3/41  brain]
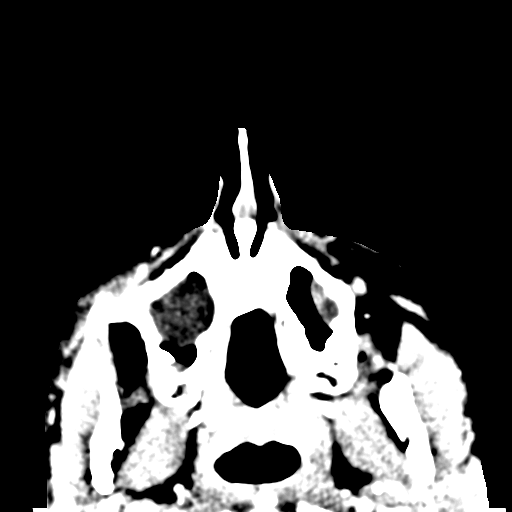
[im 3/41  bone]
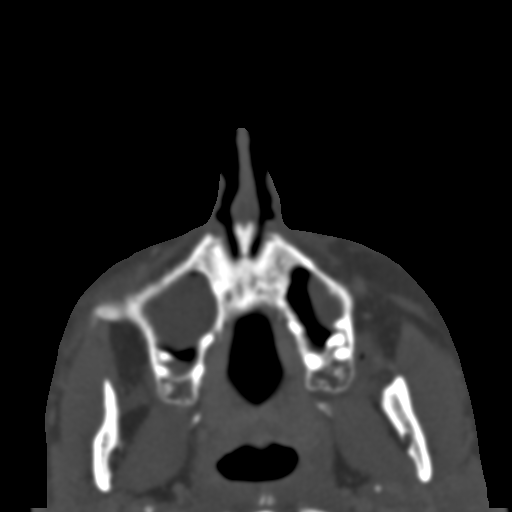
[im 9/41  bone]
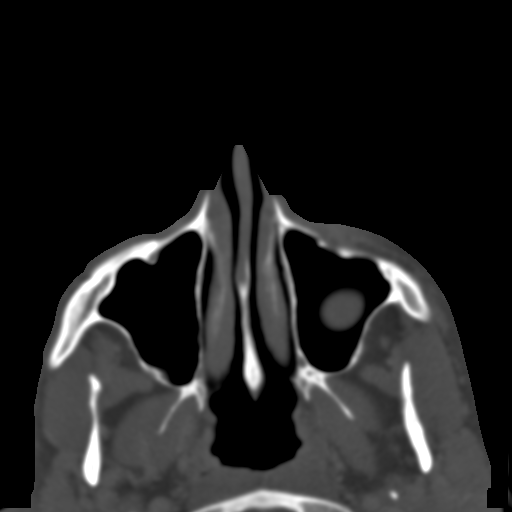
[im 13/41  bone]
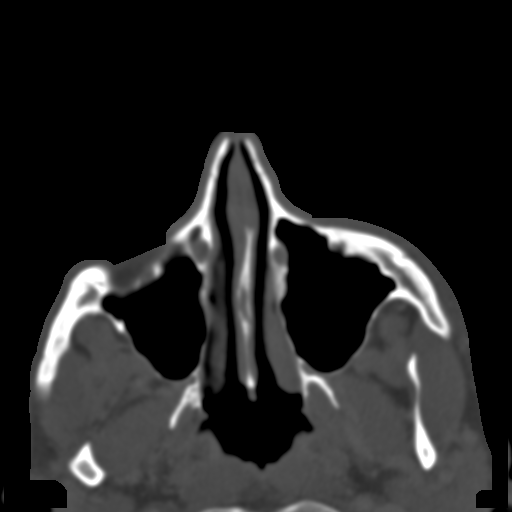
[im 18/41  bone]
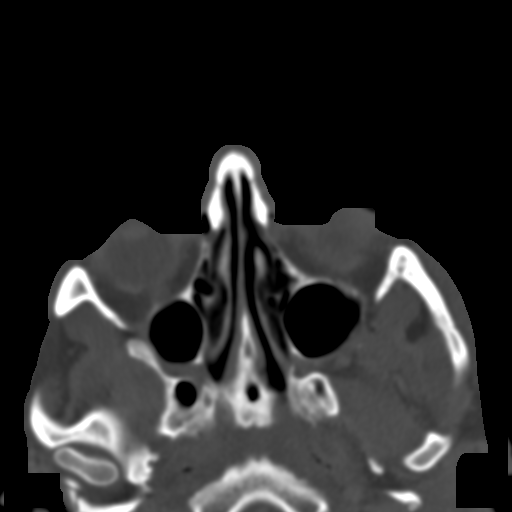
[im 23/41  brain]
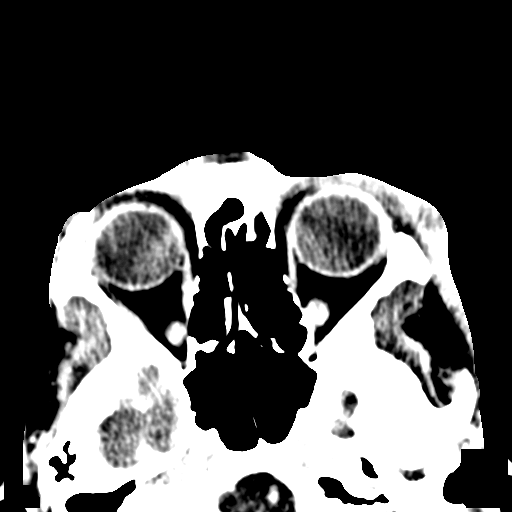
[im 23/41  bone]
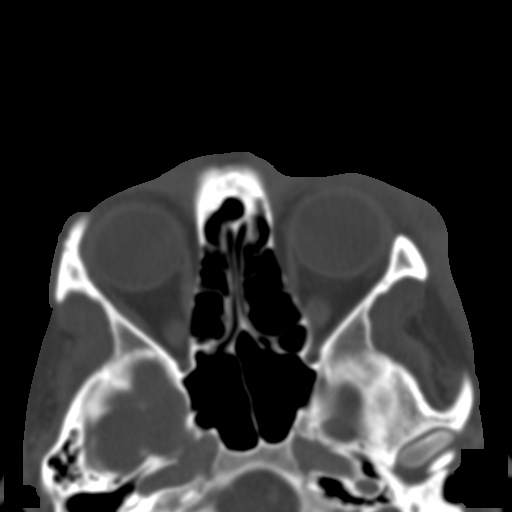
[im 28/41  bone]
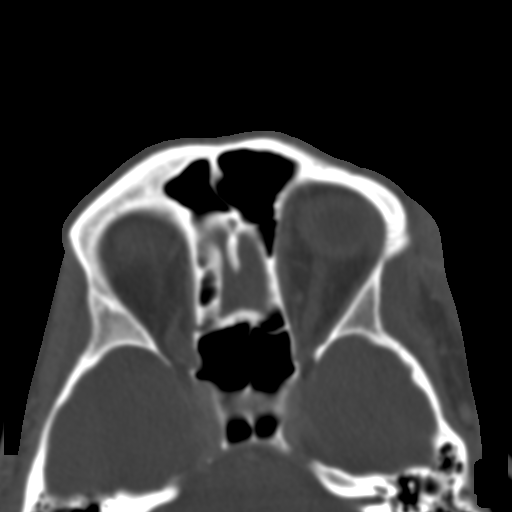
[im 32/41  bone]
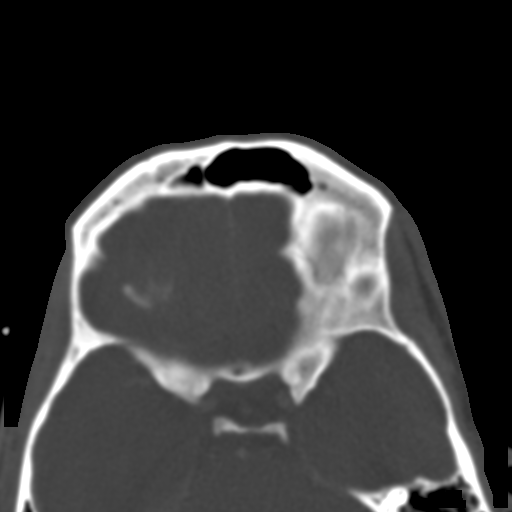
[im 38/41  bone]
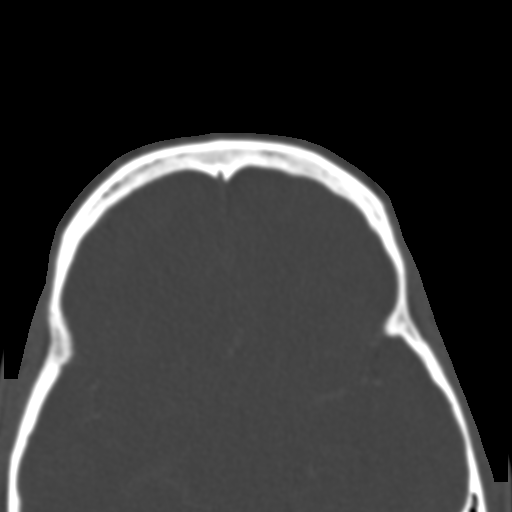

[Series 8: orbits 2.0 coronal · coronal · 0.17mm/px · 3 of 61 slices shown]
[im 21/61  bone]
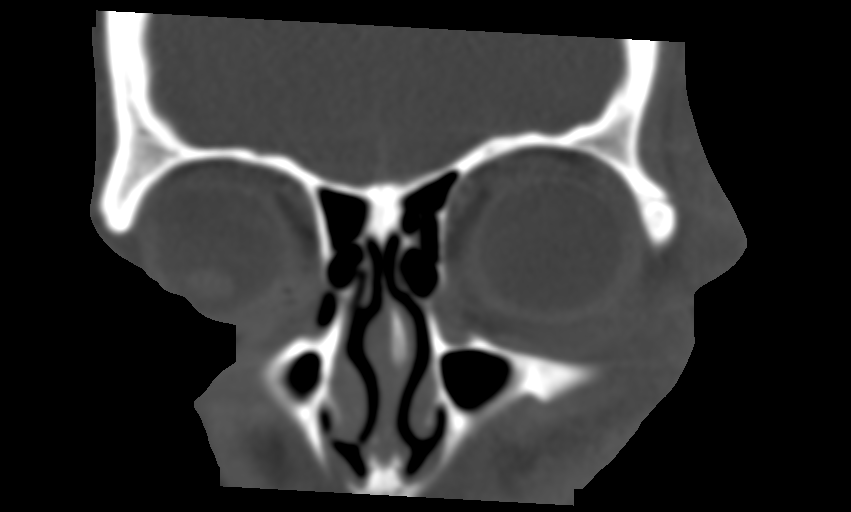
[im 27/61  bone]
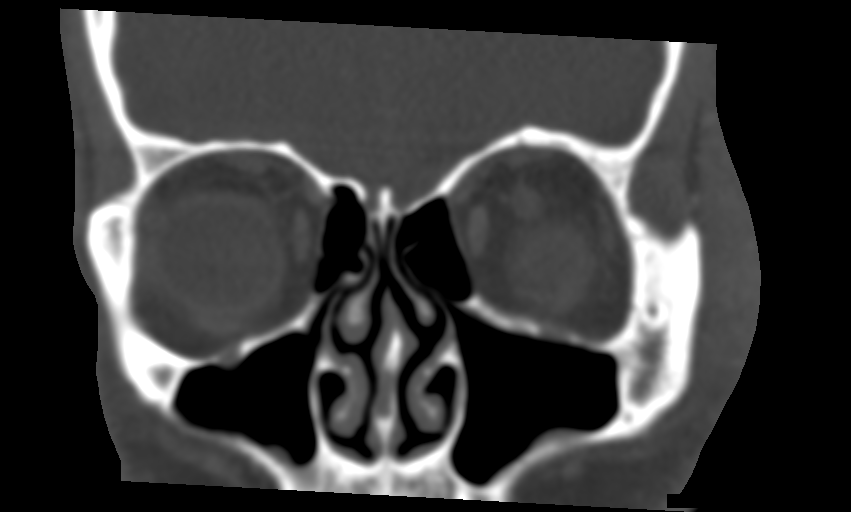
[im 34/61  bone]
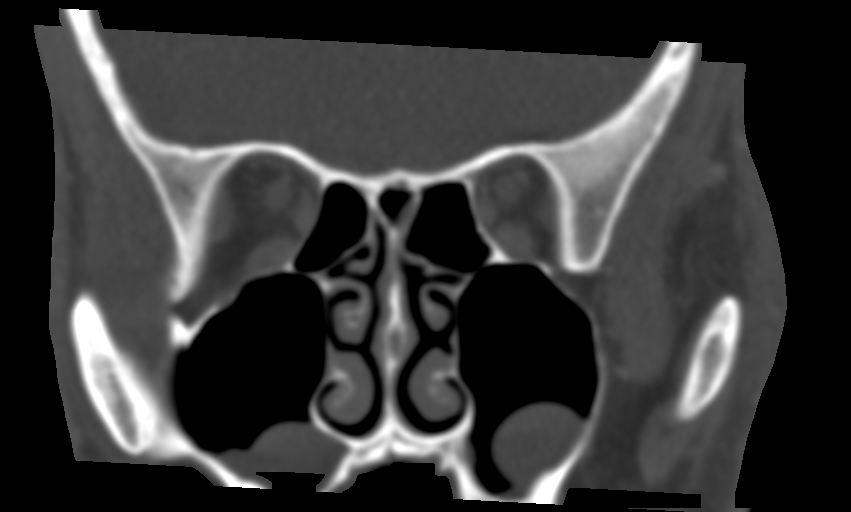

[Series 9: orbits 2.0 sagittal · sagittal · 0.17mm/px · 3 of 75 slices shown]
[im 25/75  bone]
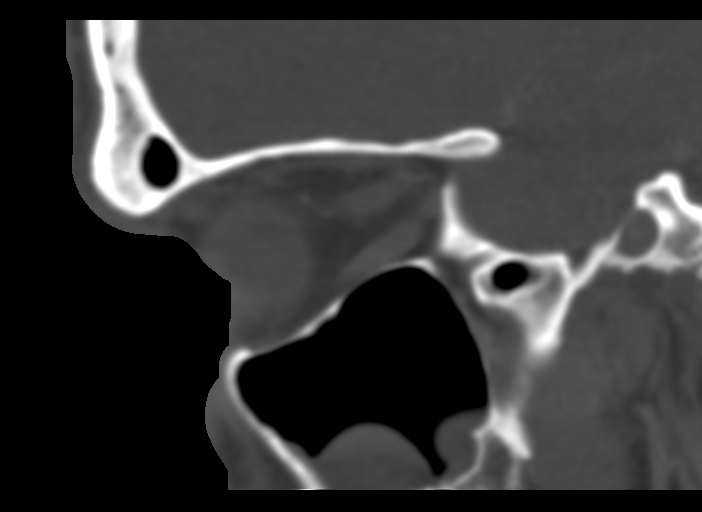
[im 38/75  bone]
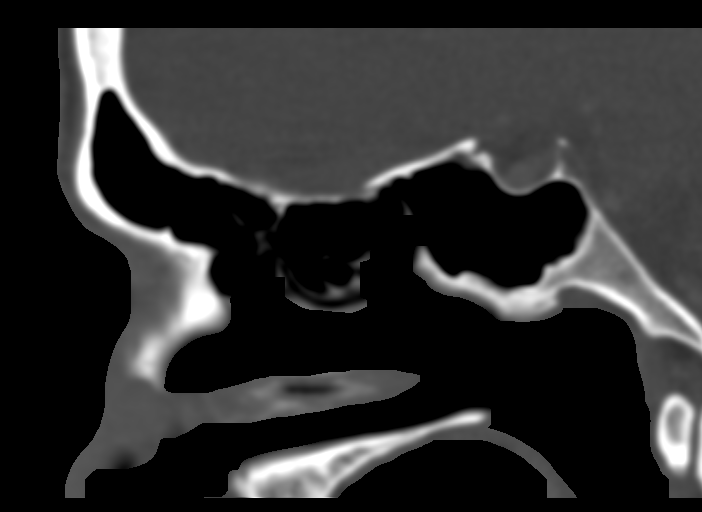
[im 50/75  bone]
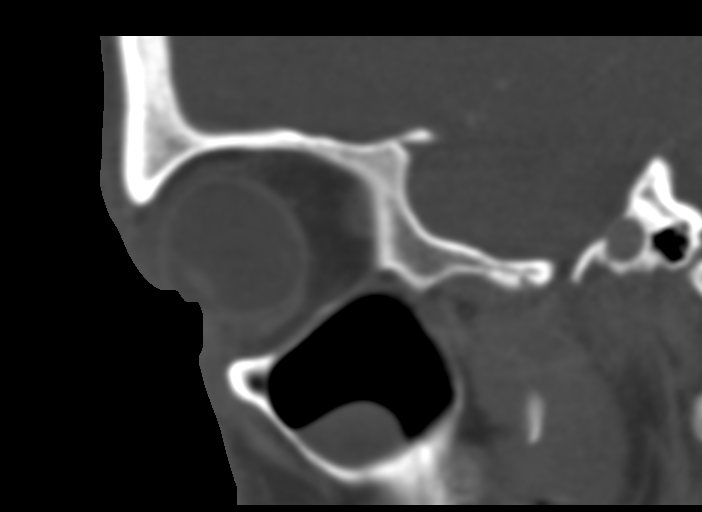

[14 of 47 positions shown; findings below may reference images not displayed]

FINDINGS: Orbits:

New since 1255 moderate to severe left side preseptal soft tissue
thickening. The left globe and postseptal soft tissues on the left
appear stable and normal.

Along the lateral aspect of the left orbit there is un 11 x 13 mm
rounded hypodense area near the epicenter of the soft tissue
thickening (series 3, image 24 and series 8, image 20). No
subcutaneous gas.

Associated soft tissue thickening extending to the left premalar
soft tissues.

The underlying bony left orbit remains normal. The right orbit soft
tissues are normal.

Visible skullbase intact. Partially visible previous left mandible
angle ORIF. No acute osseous abnormality identified.

Visualized sinuses: Small chronic maxillary sinus mucous retention
cysts, otherwise the paranasal sinuses are clear. Visible tympanic
cavities and mastoids are clear.

Soft tissues: Negative visualized pharynx, parapharyngeal,
masticator, and parotid spaces.

Limited intracranial: Negative visualized brain parenchyma. Major
vascular structures at the skullbase appear patent.
IMPRESSION: Left orbit preseptal cellulitis with an 11 x 13 mm superficial
abscess (series 3, image 24). No postseptal involvement, bony
changes or other complicating features.

## 2019-06-28 IMAGING — CR DG KNEE COMPLETE 4+V*R*
1 series · 4 of 4 positions shown · non-contrast
Comparison: None in PACs

CLINICAL DATA: Several weeks of right knee pain.  No known injury.

EXAM:
RIGHT KNEE - COMPLETE 4+ VIEW

[Series 1: dg knee complete 4 views right · 0.14mm/px · 4 of 4 slices shown]
[im 1/4]
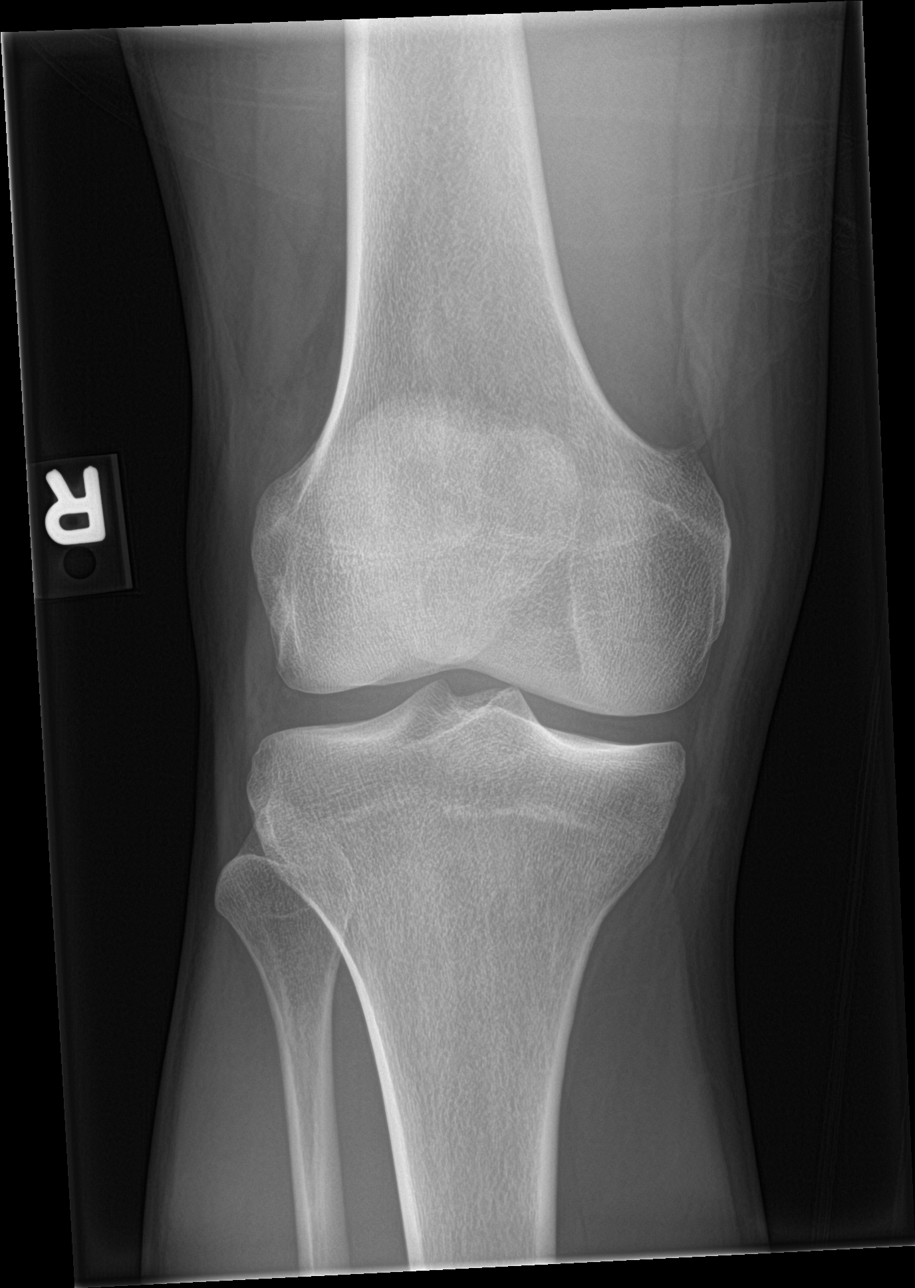
[im 2/4]
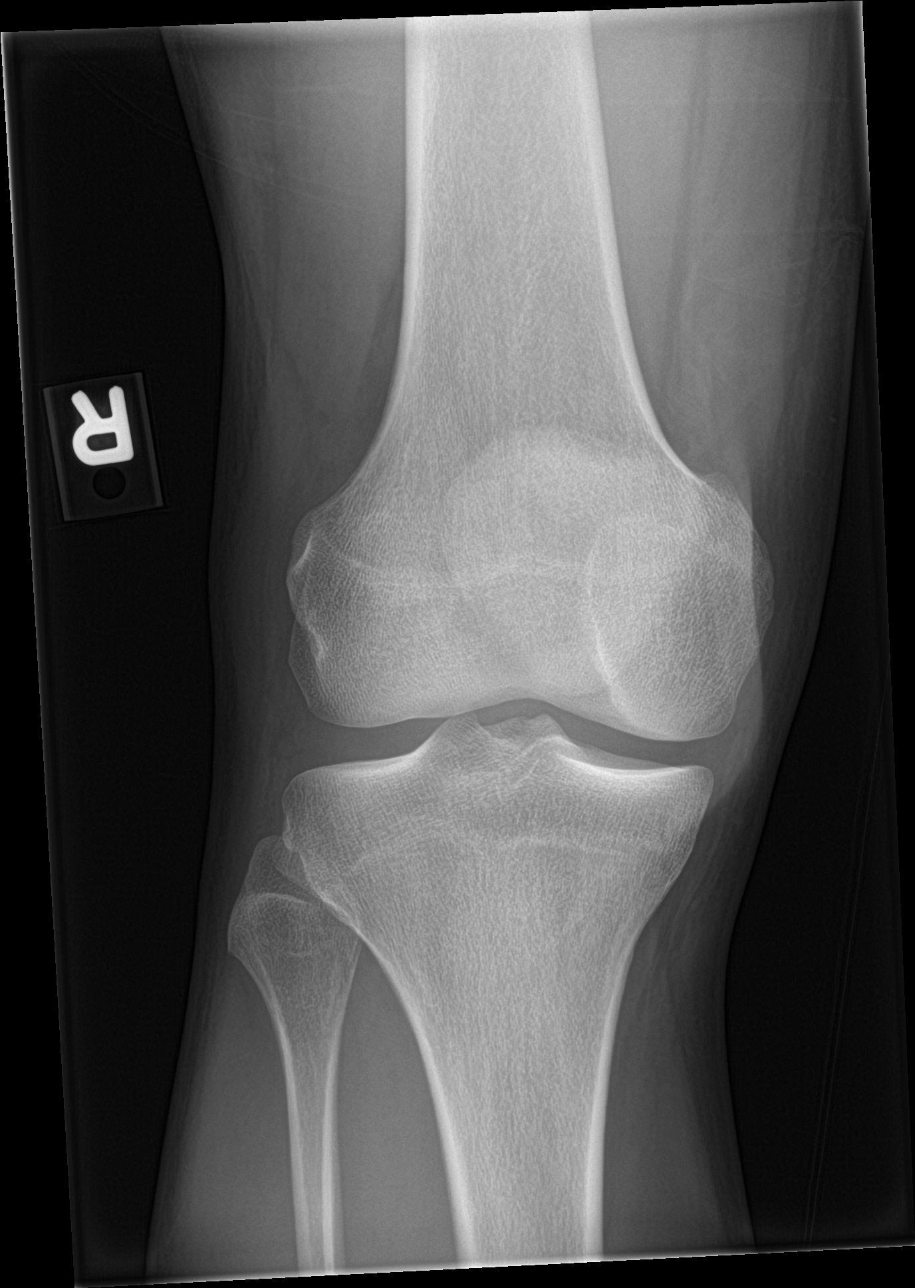
[im 3/4]
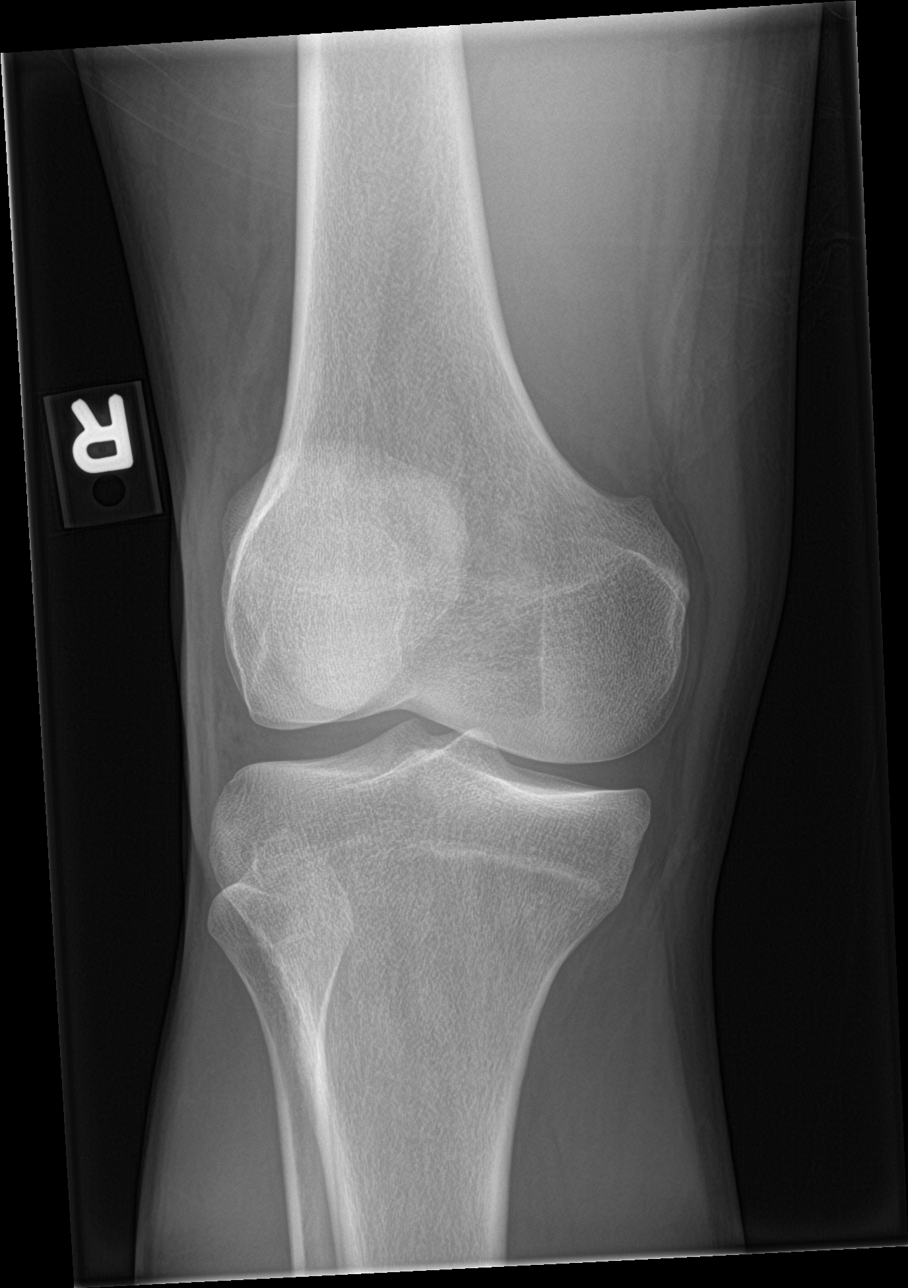
[im 4/4]
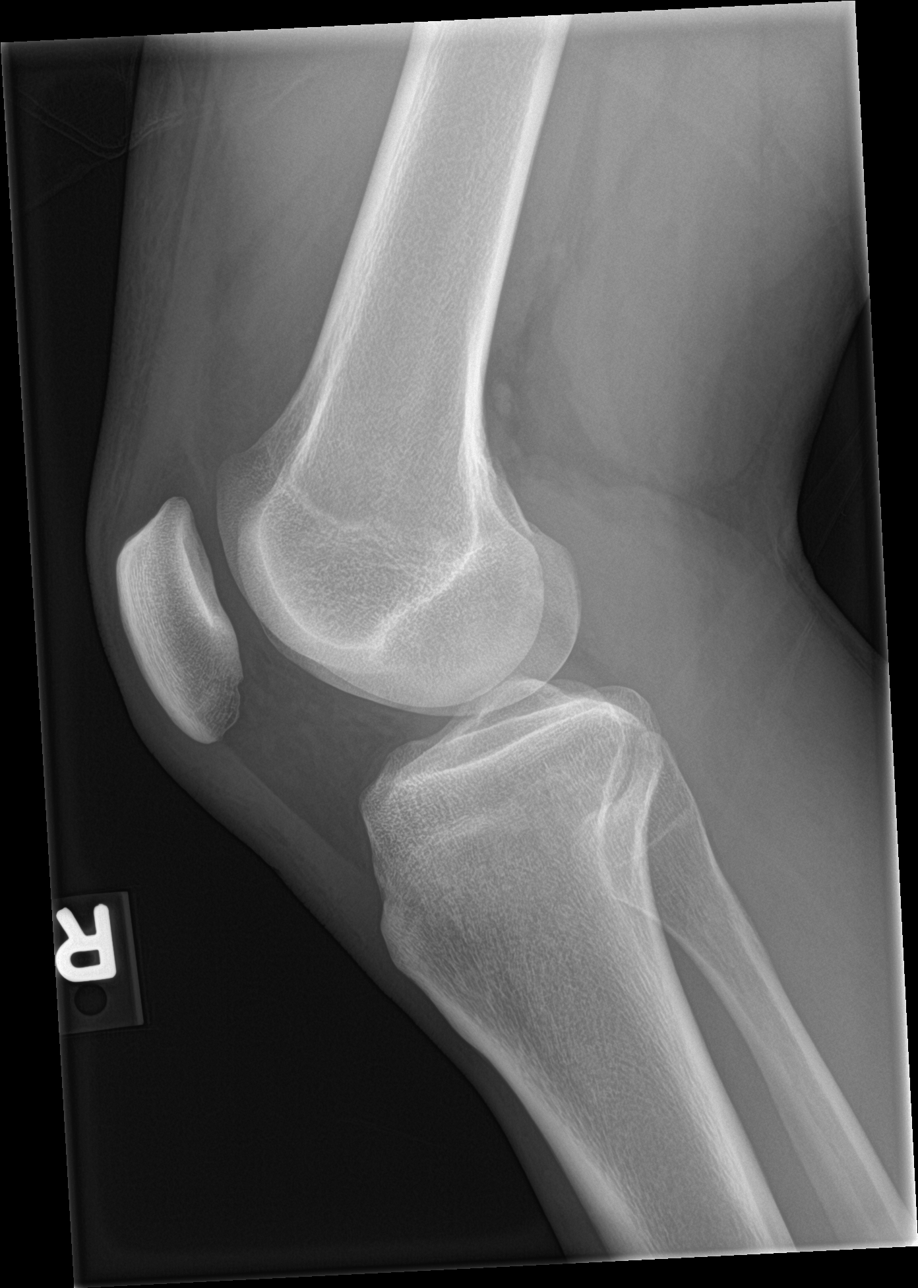

[4 of 4 positions shown; findings below may reference images not displayed]

FINDINGS: The bones are subjectively adequately mineralized. The joint spaces
are well maintained. There is no significant osteophyte formation.
There is no acute fracture or dislocation. There may be a small
suprapatellar effusion.
IMPRESSION: Possible suprapatellar effusion. No acute or significant chronic
bony abnormality.

## 2021-05-15 ENCOUNTER — Emergency Department: Payer: Medicaid Other

## 2021-05-15 ENCOUNTER — Encounter: Payer: Self-pay | Admitting: Emergency Medicine

## 2021-05-15 ENCOUNTER — Other Ambulatory Visit: Payer: Self-pay

## 2021-05-15 ENCOUNTER — Emergency Department
Admission: EM | Admit: 2021-05-15 | Discharge: 2021-05-16 | Discharge status: Against Medical Advice | Payer: Medicaid Other | Attending: Emergency Medicine | Admitting: Emergency Medicine

## 2021-05-15 DIAGNOSIS — M6282 Rhabdomyolysis: Secondary | ICD-10-CM

## 2021-05-15 DIAGNOSIS — E860 Dehydration: Secondary | ICD-10-CM | POA: Insufficient documentation

## 2021-05-15 DIAGNOSIS — R109 Unspecified abdominal pain: Secondary | ICD-10-CM

## 2021-05-15 DIAGNOSIS — F17210 Nicotine dependence, cigarettes, uncomplicated: Secondary | ICD-10-CM | POA: Insufficient documentation

## 2021-05-15 DIAGNOSIS — Z20822 Contact with and (suspected) exposure to covid-19: Secondary | ICD-10-CM | POA: Insufficient documentation

## 2021-05-15 DIAGNOSIS — R1110 Vomiting, unspecified: Secondary | ICD-10-CM | POA: Insufficient documentation

## 2021-05-15 LAB — BASIC METABOLIC PANEL
Anion gap: 10 (ref 5–15)
BUN: 14 mg/dL (ref 6–20)
CO2: 30 mmol/L (ref 22–32)
Calcium: 9.7 mg/dL (ref 8.9–10.3)
Chloride: 93 mmol/L — ABNORMAL LOW (ref 98–111)
Creatinine, Ser: 1 mg/dL (ref 0.61–1.24)
GFR, Estimated: >60 mL/min (ref >60)
Glucose, Bld: 121 mg/dL — ABNORMAL HIGH (ref 70–99)
Potassium: 3.5 mmol/L (ref 3.5–5.1)
Sodium: 133 mmol/L — ABNORMAL LOW (ref 135–145)

## 2021-05-15 LAB — HEPATIC FUNCTION PANEL
ALT: 185 U/L — ABNORMAL HIGH (ref 0–44)
AST: 234 U/L — ABNORMAL HIGH (ref 15–41)
Albumin: 4.3 g/dL (ref 3.5–5.0)
Alkaline Phosphatase: 61 U/L (ref 38–126)
Bilirubin, Direct: 0.1 mg/dL (ref 0.0–0.2)
Indirect Bilirubin: 0.6 mg/dL (ref 0.3–0.9)
Total Bilirubin: 0.7 mg/dL (ref 0.3–1.2)
Total Protein: 7.8 g/dL (ref 6.5–8.1)

## 2021-05-15 LAB — CBC
HCT: 49.6 % (ref 39.0–52.0)
Hemoglobin: 17.4 g/dL — ABNORMAL HIGH (ref 13.0–17.0)
MCH: 30.2 pg (ref 26.0–34.0)
MCHC: 35.1 g/dL (ref 30.0–36.0)
MCV: 86.1 fL (ref 80.0–100.0)
Platelets: 375 10*3/uL (ref 150–400)
RBC: 5.76 MIL/uL (ref 4.22–5.81)
RDW: 12 % (ref 11.5–15.5)
WBC: 9.6 10*3/uL (ref 4.0–10.5)
nRBC: 0 % (ref 0.0–0.2)

## 2021-05-15 LAB — RESP PANEL BY RT-PCR (FLU A&B, COVID) ARPGX2
Influenza A by PCR: NEGATIVE
Influenza B by PCR: NEGATIVE
SARS Coronavirus 2 by RT PCR: NEGATIVE

## 2021-05-15 LAB — CK: Total CK: 4466 U/L — ABNORMAL HIGH (ref 49–397)

## 2021-05-15 LAB — LIPASE, BLOOD: Lipase: 24 U/L (ref 11–51)

## 2021-05-15 MED ORDER — SODIUM CHLORIDE 0.9 % IV BOLUS
1000.0000 mL | Freq: Once | INTRAVENOUS | Status: AC
  Administered 2021-05-15: 1000 mL via INTRAVENOUS

## 2021-05-15 NOTE — ED Provider Notes (Signed)
[facility] Emergency Department Provider Note  ____________________________________________   Event Date/Time   First MD Initiated Contact with Patient 05/15/21 1902     (approximate)  I have reviewed the triage vital signs    HISTORY  Chief Complaint Flank Pain    HPI Nicholas Benson is a 35 y.o. male who presents with flank pain.  Patient reports 3 days of intermittent flank pain he reports on both sides of his back, mostly on the left side radiating into his abdomen.  The pain is intermittent, worse after urinating, nothing makes it better.  States that he is only been urinating 2-3 times a day and that when he does urinate afterwards it starts to hurt in his kidney but does not really bother him when he pees.  Denies any blood in his urine.  No history of kidney stones.  He denies any pain in his right lower quadrant.  Denies any chest pain, shortness of breath, cough.  Denies any pain or swelling in his testicles.  Denies any discharge from his penis.  Has had some associated vomiting with it.  Slightly improved today and does not feel nauseous.  Pain is currently mild now     History reviewed. No pertinent past medical history.  There are no problems to display for this patient.   History reviewed. No pertinent surgical history.  Prior to Admission medications   Medication Sig Start Date End Date Taking? Authorizing Provider  cephALEXin (KEFLEX) 500 MG capsule Take 1 capsule (500 mg total) by mouth 3 (three) times daily. 03/15/18   Fisher, Roselyn Bering, PA-C  mupirocin ointment (BACTROBAN) 2 % Apply 1 application topically 2 (two) times daily. 03/15/18   Faythe Ghee, PA-C    Allergies Patient has no known allergies.  History reviewed. No pertinent family history.  Social History Social History   Tobacco Use   Smoking status: Every Day    Packs/day: 1.00    Years: 8.00    Pack years: 8.00    Types: Cigarettes   Smokeless tobacco:  Never  Substance Use Topics   Alcohol use: No   Drug use: Yes    Comment: heroin      Review of Systems Constitutional: No fever/chills Eyes: No visual changes. ENT: No sore throat. Cardiovascular: Denies chest pain. Respiratory: no SOB. No cough Gastrointestinal: No abdominal pain.  No nausea, no vomiting.  No diarrhea.  No constipation. Genitourinary: no severe blood in urine Musculoskeletal: + flank pain Skin: Negative for rash. Neurological: Negative for headaches, focal weakness or numbness. All other ROS negative ____________________________________________   PHYSICAL EXAM:  VITAL SIGNS: ED Triage Vitals  Enc Vitals Group     BP 05/15/21 1844 (!) 158/107     Pulse Rate 05/15/21 1844 (!) 106     Resp 05/15/21 1844 18     Temp 05/15/21 1844 98.4 F (36.9 C)     Temp Source 05/15/21 1844 Oral     SpO2 05/15/21 1844 100 %     Weight 05/15/21 1841 165 lb (74.8 kg)     Height 05/15/21 1841 5\' 9"  (1.753 m)     Head Circumference --      Peak Flow --      Pain Score 05/15/21 1835 5     Pain Loc --      Pain Edu? --      Excl. in GC? --     Constitutional: Alert and oriented.  Appears comfortable Eyes:  Conjunctivae are normal. EOMI. Head: Atraumatic. Nose: No congestion/rhinnorhea. Mouth/Throat: Mucous membranes are moist.   Neck: No stridor. Trachea Midline. FROM Cardiovascular: Normal rate, regular rhythm.  Good peripheral circulation. Respiratory: no audible stridor, work of breathing  Gastrointestinal: Soft and nontender. No distention.  Musculoskeletal: No lower extremity tenderness nor edema.  No joint effusions. Neurologic:  Normal speech and language. No gross focal neurologic deficits are appreciated.  Skin:  Skin is warm, dry and intact. No rash noted. Psychiatric: Mood and affect are normal. Speech and behavior are normal. GU: Deferred  Flank tenderness mostly on the right.  ____________________________________________   LABS (all labs ordered  are listed, but only abnormal results are displayed)  Labs Reviewed  CBC - Abnormal; Notable for the following components:      Result Value   Hemoglobin 17.4 (*)    All other components within normal limits  BASIC METABOLIC PANEL - Abnormal; Notable for the following components:   Sodium 133 (*)    Chloride 93 (*)    Glucose, Bld 121 (*)    All other components within normal limits  URINALYSIS, COMPLETE (UACMP) WITH MICROSCOPIC  HEPATIC FUNCTION PANEL  LIPASE, BLOOD   ____________________________________________  RADIOLOGY Vela Prose, personally viewed and evaluated these images (plain radiographs) as part of my medical decision making, as well as reviewing the written report by the radiologist.  ED MD interpretation: No kidney stone noted  Official radiology report(s): CT Renal Stone Study  Result Date: 05/15/2021 CLINICAL DATA:  Left flank pain radiating to left lower abdomen, dysuria EXAM: CT ABDOMEN AND PELVIS WITHOUT CONTRAST TECHNIQUE: Multidetector CT imaging of the abdomen and pelvis was performed following the standard protocol without IV contrast. Unenhanced CT was performed per clinician order. Lack of IV contrast limits sensitivity and specificity, especially for evaluation of abdominal/pelvic solid viscera. COMPARISON:  None. FINDINGS: Lower chest: No acute pleural or parenchymal lung disease. Hepatobiliary: Multiple calcified liver granulomata are noted. No other focal liver abnormality. The gallbladder is unremarkable. Pancreas: Unremarkable. No pancreatic ductal dilatation or surrounding inflammatory changes. Spleen: Multiple calcified splenic granulomata.  Normal size. Adrenals/Urinary Tract: No urinary tract calculi or obstructive uropathy. Adrenals are unremarkable. The bladder is grossly normal. Stomach/Bowel: No bowel obstruction or ileus. Moderate retained stool throughout the colon. No bowel obstruction or ileus. Normal appendix right lower quadrant. No bowel  wall thickening or inflammatory change. Vascular/Lymphatic: Aortic atherosclerosis. No enlarged abdominal or pelvic lymph nodes. Reproductive: Prostate is unremarkable. A 1.5 cm cyst within the left hemiscrotum likely reflects an epididymal cyst. Other: No free fluid or free gas.  No abdominal wall hernia. Musculoskeletal: No acute or destructive bony lesions. Reconstructed images demonstrate no additional findings. IMPRESSION: 1. No urinary tract calculi or obstructive uropathy. 2. No acute intra-abdominal or intrapelvic process. Normal appendix. 3. Moderate fecal retention. 4. Probable 1.5 cm left epididymal cyst, incompletely evaluated on this study. 5.  Aortic Atherosclerosis (ICD10-I70.0). Electronically Signed   By: Sharlet Salina M.D.   On: 05/15/2021 19:28    ____________________________________________   PROCEDURES  Procedure(s) performed (including Critical Care):  Procedures   ____________________________________________   INITIAL IMPRESSION / ASSESSMENT AND PLAN / ED COURSE  Claretha Cooper Benson was evaluated in Emergency Department on 05/15/2021 for the symptoms described in the history of present illness. He was evaluated in the context of the global COVID-19 pandemic, which necessitated consideration that the patient might be at risk for infection with the SARS-CoV-2 virus that causes COVID-19. Institutional protocols and algorithms  that pertain to the evaluation of patients at risk for COVID-19 are in a state of rapid change based on information released by regulatory bodies including the CDC and federal and state organizations. These policies and algorithms were followed during the patient's care in the ED.     Pt presents with flank pain.  Suspect this is most likely kidney stone. Will get labs to evaluate for electrolyte abnormalities/AKI.  CT scan ordered to evaluate for kidney stone.  Also consider appendicitis vs SBO although less likely given description of pain.  Will get UA  to evaluate for UTI/pyelo.  We will give some fluid due to his tachycardia but patient is declining any nausea or pain medicine at this time.  CT scan without evidence of kidney stone.  His LFTs are slightly elevated.  Patient does report history of hepatitis.  He has no right upper quadrant tenderness.  Discussed that patient needs to follows up with his PCP or the health department to see if he needs any treatment for his hepatitis and to monitor his liver function test.  He expressed understanding.  Incidental findings noted on CT scan including some moderate fecal retention as well as well.  They discussed this with patient and he is aware and will take some MiraLAX.  Also incidentally noted cyst.  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  He states that maybe he just pulled something but because he has not been urinating that he wanted to make sure that was nothing more severe.  Even after a liter of fluid patient's bladder scan is only 100 cc .  Given the elevated LFTs we will add on CK to see if he has any evidence of rhabdo that could be causing him to be very dehydrated.  We will give an additional liter of fluid.  Patient will be handed off to oncoming team pending CK level/urine  ____________________________________________   FINAL CLINICAL IMPRESSION(S) / ED DIAGNOSES   Final diagnoses:  Dehydration      MEDICATIONS GIVEN DURING THIS VISIT:  Medications  sodium chloride 0.9 % bolus 1,000 mL (0 mLs Intravenous Stopped 05/15/21 2046)  sodium chloride 0.9 % bolus 1,000 mL ( Intravenous Canceled Entry 05/15/21 2241)     ED Discharge Orders     None        Note:  This document was prepared using Dragon voice recognition software and may include unintentional dictation errors.   Concha Se, MD 05/15/21 [phone]

## 2021-05-15 NOTE — ED Notes (Signed)
Pt aware we need urine sample, given urine specimen cup.

## 2021-05-15 NOTE — ED Notes (Signed)
Pt reports being unable to urinate as of this time, will attempt again. Fluids infusing.

## 2021-05-15 NOTE — ED Provider Notes (Signed)
Patient was signed out to me at 11 PM.  In brief he presents with left flank pain.  CT showing constipation but no stone.  He is notably not urinated in a long time even after fluids.  Labs notable for elevated AST and ALT.  He is pending a CK at the time of signout.  4000.  I discussed the results with the patient and asked that he stay to have the CK redrawn, as this was prior to him receiving 2 L of fluids.  The patient wished to leave.  Explained the risks and benefits and he understood.  Patient will leave AGAINST MEDICAL ADVICE.   Georga Hacking, MD 05/15/21 2350

## 2021-05-15 NOTE — ED Triage Notes (Signed)
Pt to ED for left flank pain radiating to lower left abd with burning with urination. NAD noted

## 2021-05-15 NOTE — Discharge Instructions (Addendum)
Your blood work shows that you have signs of muscle breakdown, which could be why you are sore. The treatment is to stay hydrated. If your symptoms are worsening, please return to the emergency department. Please follow-up with your primary care provider to have your blood work re-checked.    Take some over-the-counter MiraLAX to help with the constipation.  Continue to drink plenty of fluids including Gatorade without sugar, Pedialyte.  Return to the ER for worsening symptoms or any other concerns.  Follow-up with PCP for your elevated blood pressures.  If they are still elevated they may need to be started on some treatments   IMPRESSION: 1. No urinary tract calculi or obstructive uropathy. 2. No acute intra-abdominal or intrapelvic process. Normal appendix. 3. Moderate fecal retention. 4. Probable 1.5 cm left epididymal cyst, incompletely evaluated on this study. 5.  Aortic Atherosclerosis (ICD10-I70.0).

## 2021-05-15 NOTE — ED Notes (Signed)
Bladder scan shows total volume of 

## 2022-11-05 IMAGING — CT CT RENAL STONE PROTOCOL
2 of 4 series · 16 of 46 positions shown, 18 images · non-contrast
Comparison: None.

CLINICAL DATA: Left flank pain radiating to left lower abdomen,
dysuria

EXAM:
CT ABDOMEN AND PELVIS WITHOUT CONTRAST
TECHNIQUE: Multidetector CT imaging of the abdomen and pelvis was performed
following the standard protocol without IV contrast. Unenhanced CT
was performed per clinician order. Lack of IV contrast limits
sensitivity and specificity, especially for evaluation of
abdominal/pelvic solid viscera.

[Series 2: stone full standard · axial · 0.69mm/px · z∈[-1154,-714]mm · 13 of 96 slices shown, 15 images]
[im 4/96  soft-tissue]
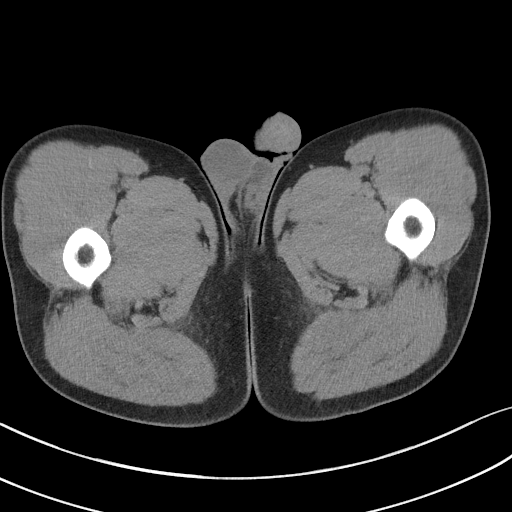
[im 4/96  bone]
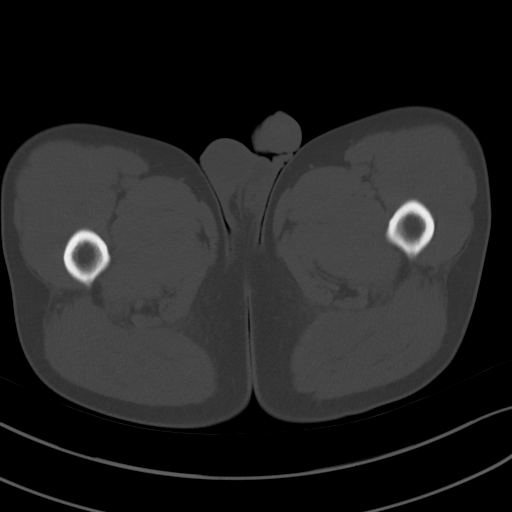
[im 12/96  soft-tissue]
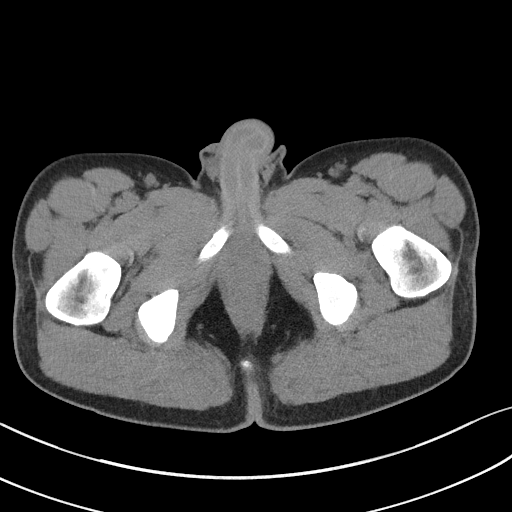
[im 20/96  soft-tissue]
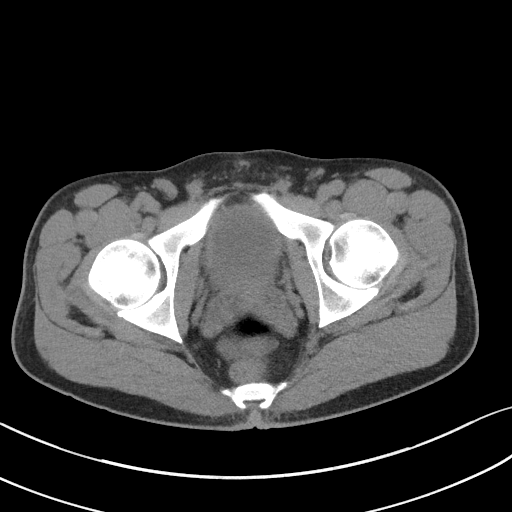
[im 27/96  soft-tissue]
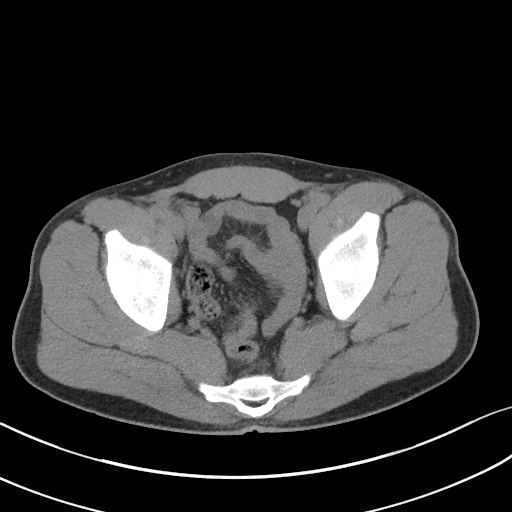
[im 35/96  soft-tissue]
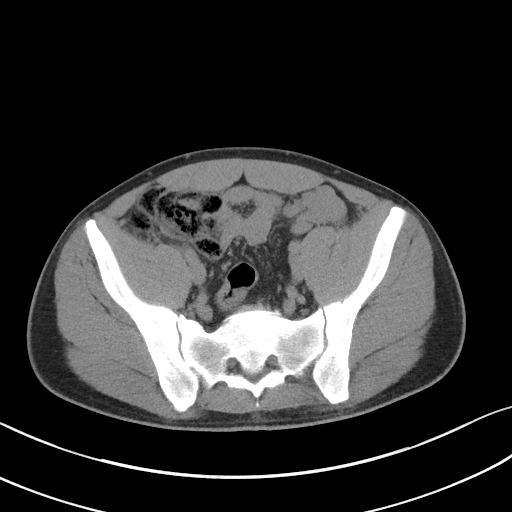
[im 42/96  soft-tissue]
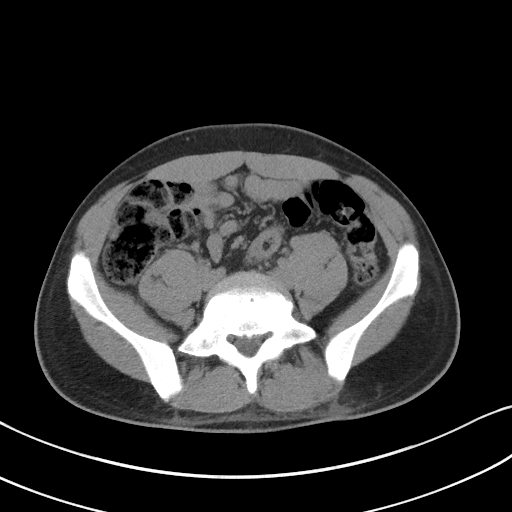
[im 50/96  soft-tissue]
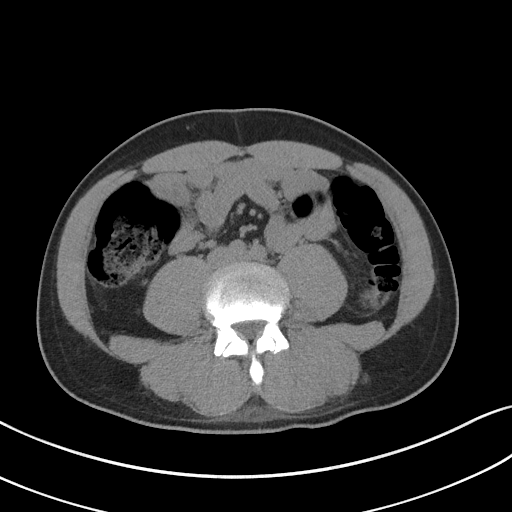
[im 54/96  soft-tissue]
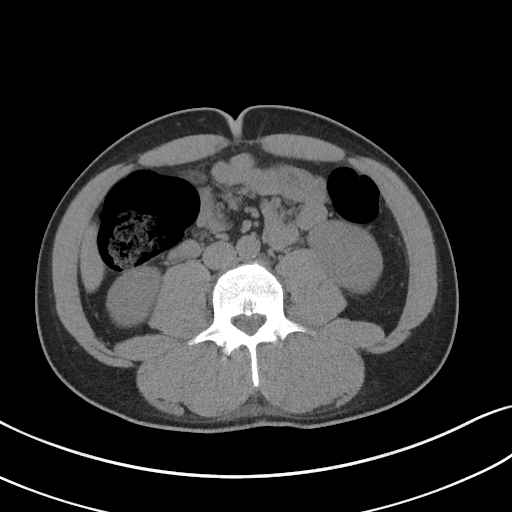
[im 61/96  soft-tissue]
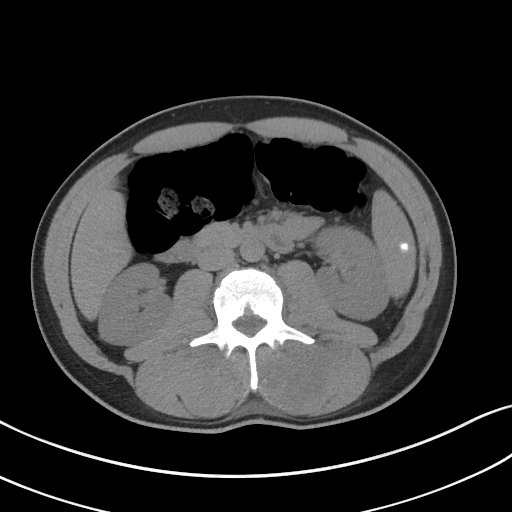
[im 61/96  bone]
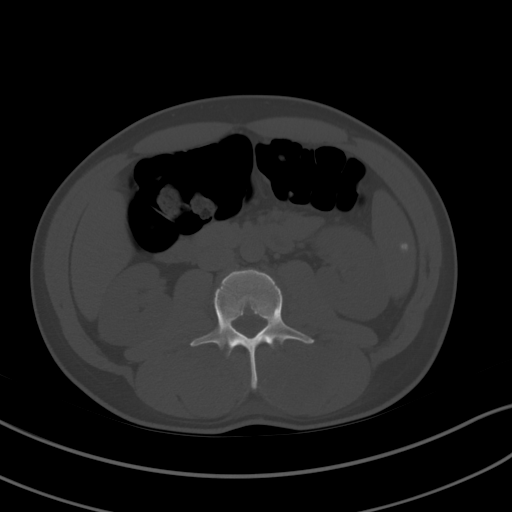
[im 69/96  soft-tissue]
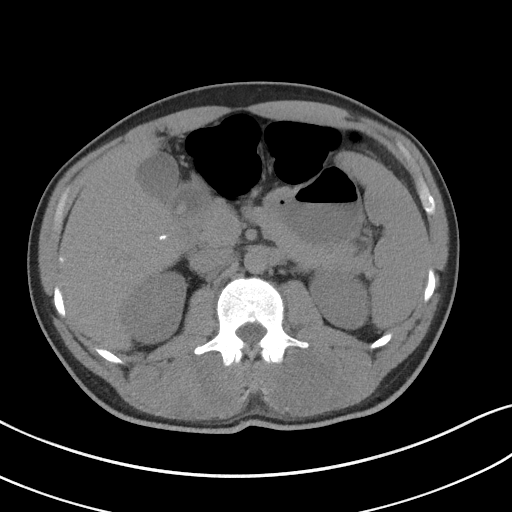
[im 77/96  soft-tissue]
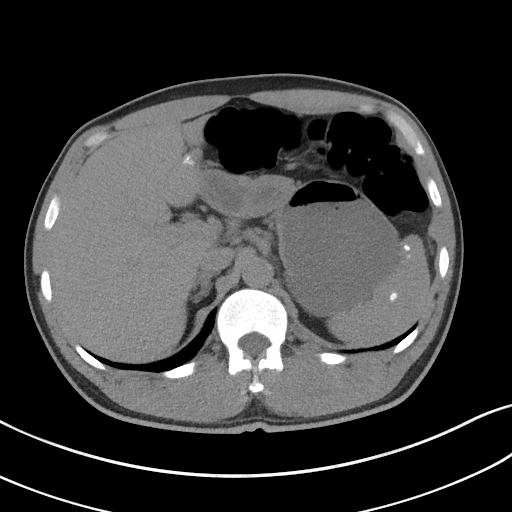
[im 84/96  soft-tissue]
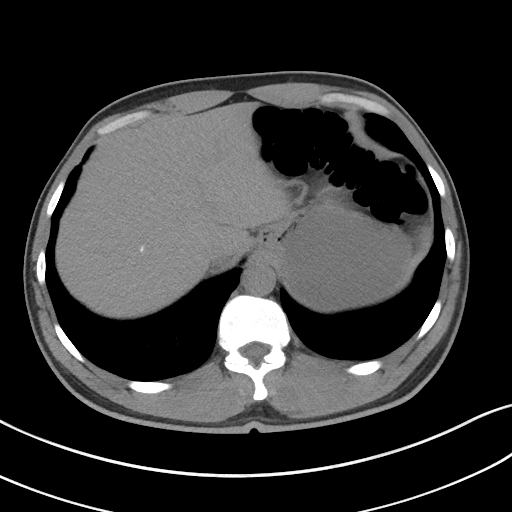
[im 92/96  soft-tissue]
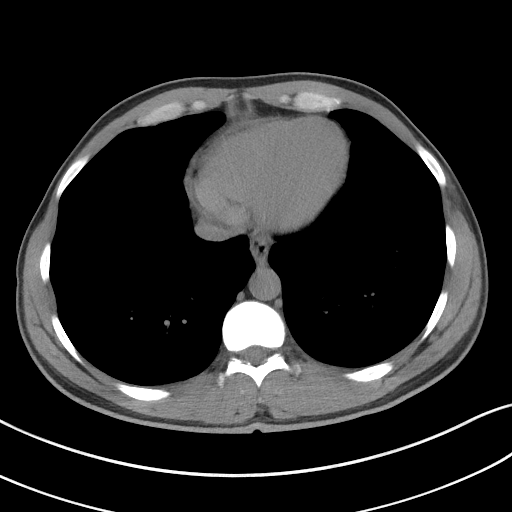

[Series 5: coronal · coronal · 0.69mm/px · 3 of 123 slices shown]
[im 41/123  soft-tissue]
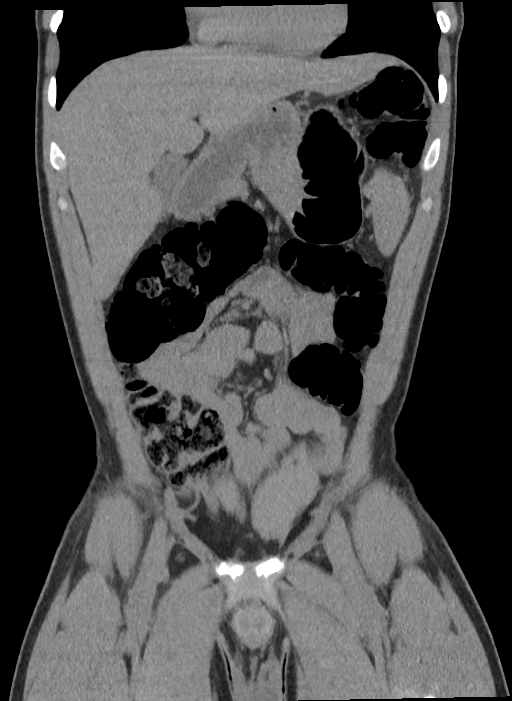
[im 55/123  soft-tissue]
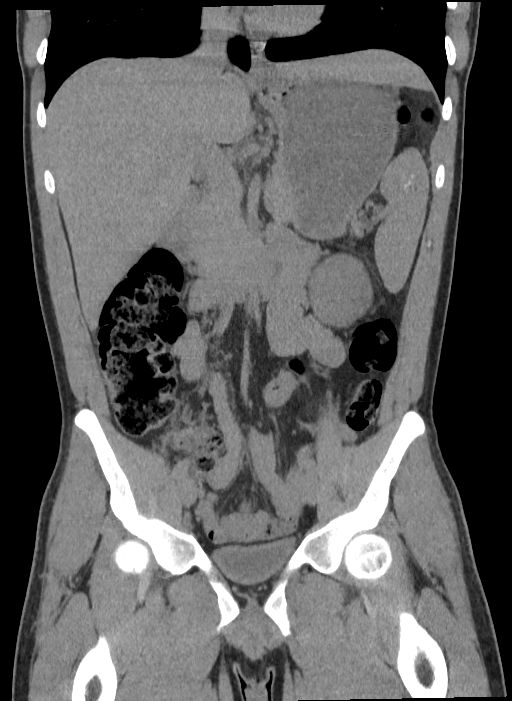
[im 68/123  soft-tissue]
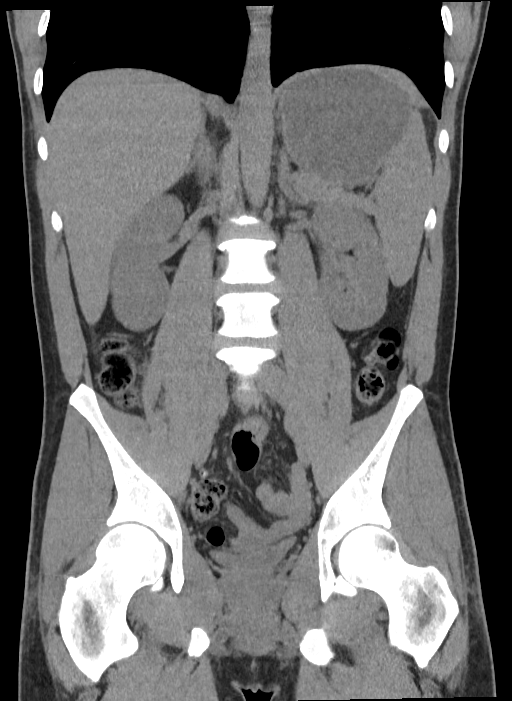

[16 of 46 positions shown; findings below may reference images not displayed]

FINDINGS: Lower chest: No acute pleural or parenchymal lung disease.

Hepatobiliary: Multiple calcified liver granulomata are noted. No
other focal liver abnormality. The gallbladder is unremarkable.

Pancreas: Unremarkable. No pancreatic ductal dilatation or
surrounding inflammatory changes.

Spleen: Multiple calcified splenic granulomata.  Normal size.

Adrenals/Urinary Tract: No urinary tract calculi or obstructive
uropathy. Adrenals are unremarkable. The bladder is grossly normal.

Stomach/Bowel: No bowel obstruction or ileus. Moderate retained
stool throughout the colon. No bowel obstruction or ileus. Normal
appendix right lower quadrant. No bowel wall thickening or
inflammatory change.

Vascular/Lymphatic: Aortic atherosclerosis. No enlarged abdominal or
pelvic lymph nodes.

Reproductive: Prostate is unremarkable. A 1.5 cm cyst within the
left hemiscrotum likely reflects an epididymal cyst.

Other: No free fluid or free gas.  No abdominal wall hernia.

Musculoskeletal: No acute or destructive bony lesions. Reconstructed
images demonstrate no additional findings.
IMPRESSION: 1. No urinary tract calculi or obstructive uropathy.
2. No acute intra-abdominal or intrapelvic process. Normal appendix.
3. Moderate fecal retention.
4. Probable 1.5 cm left epididymal cyst, incompletely evaluated on
this study.
5.  Aortic Atherosclerosis ([id number]).
